# Patient Record
Sex: Male | Born: 1970 | Race: Black or African American | Hispanic: No | Marital: Married | Smoking: Never smoker
Health system: Southern US, Community
[De-identification: ages and names within clinical notes are randomized; demographics above are authoritative.]

## PROBLEM LIST (undated history)

## (undated) DIAGNOSIS — I10 Essential (primary) hypertension: Secondary | ICD-10-CM

## (undated) DIAGNOSIS — N289 Disorder of kidney and ureter, unspecified: Secondary | ICD-10-CM

## (undated) DIAGNOSIS — F419 Anxiety disorder, unspecified: Secondary | ICD-10-CM

## (undated) DIAGNOSIS — K219 Gastro-esophageal reflux disease without esophagitis: Secondary | ICD-10-CM

## (undated) DIAGNOSIS — J302 Other seasonal allergic rhinitis: Secondary | ICD-10-CM

## (undated) DIAGNOSIS — N183 Chronic kidney disease, stage 3 (moderate): Secondary | ICD-10-CM

## (undated) DIAGNOSIS — E119 Type 2 diabetes mellitus without complications: Secondary | ICD-10-CM

## (undated) DIAGNOSIS — M1990 Unspecified osteoarthritis, unspecified site: Secondary | ICD-10-CM

## (undated) HISTORY — DX: Essential (primary) hypertension: I10

## (undated) HISTORY — DX: Chronic kidney disease, stage 3 (moderate): N18.3

## (undated) HISTORY — DX: Anxiety disorder, unspecified: F41.9

## (undated) HISTORY — DX: Type 2 diabetes mellitus without complications: E11.9

## (undated) HISTORY — DX: Gastro-esophageal reflux disease without esophagitis: K21.9

---

## 2002-09-29 ENCOUNTER — Encounter: Payer: Self-pay | Admitting: *Deleted

## 2002-09-29 ENCOUNTER — Emergency Department (HOSPITAL_COMMUNITY): Admission: EM | Admit: 2002-09-29 | Discharge: 2002-09-29 | Payer: Self-pay | Admitting: *Deleted

## 2004-01-03 ENCOUNTER — Encounter: Admission: RE | Admit: 2004-01-03 | Discharge: 2004-02-26 | Payer: Self-pay | Admitting: Family Medicine

## 2008-07-18 ENCOUNTER — Ambulatory Visit (HOSPITAL_COMMUNITY): Payer: Self-pay | Admitting: Psychiatry

## 2008-07-19 ENCOUNTER — Ambulatory Visit (HOSPITAL_COMMUNITY): Payer: Self-pay | Admitting: Psychiatry

## 2008-07-20 ENCOUNTER — Inpatient Hospital Stay (HOSPITAL_COMMUNITY): Admission: AD | Admit: 2008-07-20 | Discharge: 2008-07-23 | Payer: Self-pay | Admitting: *Deleted

## 2008-07-20 ENCOUNTER — Ambulatory Visit: Payer: Self-pay | Admitting: *Deleted

## 2008-07-25 ENCOUNTER — Ambulatory Visit (HOSPITAL_COMMUNITY): Payer: Self-pay | Admitting: Psychiatry

## 2008-07-26 ENCOUNTER — Ambulatory Visit (HOSPITAL_COMMUNITY): Payer: Self-pay | Admitting: Psychiatry

## 2008-08-02 ENCOUNTER — Ambulatory Visit (HOSPITAL_COMMUNITY): Payer: Self-pay | Admitting: Psychiatry

## 2008-08-15 ENCOUNTER — Ambulatory Visit (HOSPITAL_COMMUNITY): Payer: Self-pay | Admitting: Psychiatry

## 2008-08-29 ENCOUNTER — Ambulatory Visit (HOSPITAL_COMMUNITY): Payer: Self-pay | Admitting: Psychiatry

## 2008-09-12 ENCOUNTER — Ambulatory Visit (HOSPITAL_COMMUNITY): Payer: Self-pay | Admitting: Psychiatry

## 2008-09-26 ENCOUNTER — Ambulatory Visit (HOSPITAL_COMMUNITY): Payer: Self-pay | Admitting: Psychiatry

## 2010-01-06 ENCOUNTER — Emergency Department (HOSPITAL_COMMUNITY): Admission: EM | Admit: 2010-01-06 | Discharge: 2010-01-06 | Payer: Self-pay | Admitting: Emergency Medicine

## 2010-10-13 LAB — COMPREHENSIVE METABOLIC PANEL
ALT: 23 U/L (ref 0–35)
Alkaline Phosphatase: 91 U/L (ref 39–117)
BUN: 6 mg/dL (ref 6–23)
CO2: 28 mEq/L (ref 19–32)
Calcium: 8.9 mg/dL (ref 8.4–10.5)
GFR calc non Af Amer: 38 mL/min — ABNORMAL LOW (ref >60)
Glucose, Bld: 116 mg/dL — ABNORMAL HIGH (ref 70–99)
Potassium: 3.9 mEq/L (ref 3.5–5.1)
Total Protein: 7.5 g/dL (ref 6.0–8.3)

## 2010-10-13 LAB — URINALYSIS, ROUTINE W REFLEX MICROSCOPIC
Bilirubin Urine: NEGATIVE
Glucose, UA: NEGATIVE mg/dL
Ketones, ur: NEGATIVE mg/dL
Specific Gravity, Urine: 1.011 (ref 1.005–1.030)
pH: 7 (ref 5.0–8.0)

## 2010-10-13 LAB — CBC
HCT: 45 % (ref 36.0–46.0)
Hemoglobin: 15 g/dL (ref 12.0–15.0)
MCHC: 33.2 g/dL (ref 30.0–36.0)
RBC: 4.97 MIL/uL (ref 3.87–5.11)
RDW: 13.4 % (ref 11.5–15.5)

## 2010-10-13 LAB — DRUGS OF ABUSE SCREEN W/O ALC, ROUTINE URINE
Amphetamine Screen, Ur: NEGATIVE
Barbiturate Quant, Ur: NEGATIVE
Benzodiazepines.: NEGATIVE
Marijuana Metabolite: NEGATIVE
Methadone: NEGATIVE
Phencyclidine (PCP): NEGATIVE

## 2010-11-11 NOTE — Discharge Summary (Signed)
NAME:  James Griffith, James Griffith                 ACCOUNT NO.:  [id number]   MEDICAL RECORD NO.:  [id number]          PATIENT TYPE:  IPS   LOCATION:  0302                          FACILITY:  [facility]   PHYSICIAN:  Jasmine Pang, M.D. DATE OF BIRTH:  [birth year]   DATE OF ADMISSION:  07/20/2008  DATE OF DISCHARGE:                               DISCHARGE SUMMARY   IDENTIFICATION:  This is a 40 year old married African American male who  was admitted on a voluntary basis on July 18, 2008.   HISTORY OF PRESENT ILLNESS:  The patient was referred for inpatient by  Dr. Lolly Mustache, who saw him for the first time on Thursday.  Apparently, the  patient reported that he has been having suicidal thoughts, increased  impulsivity and had attempted overdose on an over-the-counter sleeping  pills twice followed with alcohol.  He states a major stressor for him  is separation from his wife.  Separation from his wife once at  Thanksgiving and once before Christmas.  Also, around Christmas, he  tried to shoot himself, but the gun did not work.  He states that when  he started feeling down, he planned to try again.  His wife is in a new  relationship, which has been discouraging for him.  Dr. Lolly Mustache felt that  the patient required immediate hospitalization on Thursday.  However,  the patient contracted for safety and at first be able to tie up with  the business matters before presenting for admission.  He did present as  promised on Friday, July 20, 2008.  Apparently, he had a minor  confrontation with the man, his wife is currently seeing.  He is only  sleeping 2 to 3 hours a night.  He is not eating.  He has lost 20  pounds.  He states that prior to this, he just had normal worry.  He  reports that he has been married approximately 5 years.  He states that  early in the relationship, he was unfaithful that was approximately 2-  1/2 to 3 years ago.  He states his wife forgave him, but they continued  to have a lot of  conflict resulting in their separation.   PAST PSYCHIATRIC HISTORY:  The patient states that after a bad  relationship in 1993, he was admitted to [facility].  He would stay up for  2 to 3 days at a time back again, but he had not had any medications  since then.   FAMILY HISTORY:  The patient had a male cousin who had been  institutionalized few times.   DRUGS AND ALCOHOL HISTORY:  The patient denies.   MEDICAL PROBLEMS:  The patient is known to have a torn rotator cuff in  2006.   MEDICATIONS:  In 1993, he took Zoloft for awhile after a bad  relationship.   DRUG ALLERGIES:  No known drug allergies.   PHYSICAL FINDINGS:  The patient was a well-developed, well-nourished  male, who appeared his stated age of 43.  He is known to have a torn  left rotator cuff, otherwise he had no remarkable  symptoms or physical  findings.   ADMISSION LABORATORIES:  UDS was negative.  Chemistries showed his  glucose was slightly elevated at 116.  His urinalysis was negative.  CBC  revealed slightly elevated WBC at 11.   HOSPITAL COURSE:  Upon admission, the patient was started on Zyprexa 5  mg p.o. q.h.s. as well as Ambien 10 mg p.o. q.h.s. p.r.n. and he was  also started on Celexa 20 mg p.o. q.h.s.  On July 21, 2008, Zyprexa  Zydis was increased to 10 mg at h.s.  In addition, he was started on  Sudafed 60 mg q.4 to 6 h. not to exceed 240 mg daily.  In individual  sessions with me, the patient was friendly and cooperative.  There was  psychomotor retardation.  His speech was normal rate and flow.  He was  alert.  He was depressed and anxious with a consisted affect.  Anxiety  level was moderate-to-severe.  There was no evidence of psychosis or  thought disorder.  On July 22, 2008, his sleep was good.  Appetite  was good.  He was less depressed, less anxious.  He discussed his recent  suicide attempts, but stated he had no suicidal ideation now.  He began  to look forward to going home.  On  July 23, 2008, the patient's mood  was stable.  Affect consistent with mood.  There was no suicidal or  homicidal ideation.  No thoughts of self-injurious behavior.  No  auditory or visual hallucinations.  No paranoia or delusions.  Thoughts  were logical and goal-directed.  Thought content no predominant theme.  Cognitive grossly intact.  Insight good.  Judgment good.  Impulse  control good.  He will turn to [place] to live.  He has a follow up  appointment with Dr. Lolly Mustache at the [facility] office.  He  will also be seen Florencia Reasons for counseling.  He did not want any  family contact by phone or family session.  He was given a list of  support groups and encouraged to attend these.  It was felt the patient  was safe for discharge today.   DISCHARGE DIAGNOSES:  Axis I:  Mood disorder, not otherwise specified.  Axis II:  None.  Axis III:  Torn left rotator cuff.  Axis IV:  Severe (problems with primary support group, burden of  psychiatric illness, pain from torn rotator cuff, other psychosocial  problems).  Axis V:  Global assessment of functioning was 50 upon discharge.  GAF  was 30 upon admission.  GAF was 65 to 70 highest past year.   DISCHARGE PLANS:  There was no activity level or dietary restrictions.   POSTHOSPITAL CARE PLANS:  The patient will see Dr. Lolly Mustache on January  26th at 8:45 a.m.  He will also see Florencia Reasons, counselor on July 25, 2008, at 3 p.m.   DISCHARGE MEDICATIONS:  1. Celexa 20 mg daily.  2. Zyprexa Zydis 10 mg daily.      Jasmine Pang, M.D.  Electronically Signed     BHS/MEDQ  D:  07/23/2008  T:  07/24/2008  Job:  [id number]

## 2010-11-11 NOTE — H&P (Signed)
NAME:  James Griffith, James Griffith                 ACCOUNT NO.:  [id number]   MEDICAL RECORD NO.:  [id number]          PATIENT TYPE:  IPS   LOCATION:  0302                          FACILITY:  [facility]   PHYSICIAN:  Jasmine Pang, M.D. DATE OF BIRTH:  Aug 15, 1970   DATE OF ADMISSION:  07/20/2008  DATE OF DISCHARGE:                       PSYCHIATRIC ADMISSION ASSESSMENT   This is a voluntary admission to the services of Dr. Milford Cage.   IDENTIFYING INFORMATION:  This is a 40 year old married African American  male.  He was referred for inpatient by Dr. Lolly Mustache, who saw him for the  first time on Thursday.  Apparently, the patient reported that he had  been having suicidal thoughts, increased impulsivity.  He has attempted  to overdose on over-the-counter sleeping pills twice, followed with  alcohol, since his wife separated from him.  Once at Thanksgiving and  once before Christmas.  Also, around Christmas, he tried to shoot  himself, but the gun did not work.  He states that when he starts  feeling really, really down, he will try again.  His wife is in a new  relationship.  Dr. Lolly Mustache felt that the patient required immediate  hospitalization on Thursday; however, the patient contracted for safety  and requested that he be able to tie up some business matters before  presenting for admission.  He did present for admission as promised  yesterday.  Apparently, he has had a recent minor confrontation with the  man his wife is currently seeing.  He is only sleeping 2-3 hours a  night.  He is not eating.  He has lost 20 pounds . He states that prior  to this, he just had normal worry.   He reports that he has been married approximately 5 years, that early in  the relationship he was unfaithful.  This was approximately 2-1/2 to 3  years ago.  His wife forgave him.   PAST PSYCHIATRIC HISTORY:  He states that after a bad relationship in  1993, he was admitted to [facility].  He would stay up for 2-3 days at  a  time back then, but he has not had any medications since then.   SOCIAL HISTORY:  He went to the 12th grade but did not graduate.  He has  been married 5 years.  He has 3 stepchildren.  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   FAMILY HISTORY:  He has a male cousin who has been institutionalized a  few times.   ALCOHOL/DRUG HISTORY:  He denies.   PRIMARY CARE Shanayah Kaffenberger:  [facility].  He just had his  first appointment with Dr. Lolly Mustache the other day.   MEDICAL PROBLEMS:  He is known to have a torn rotator cuff since 2006.   MEDICATIONS:  Back in 1993, he did take Zoloft for a while after a bad  relationship.   DRUG ALLERGIES:  No known drug allergies.   POSITIVE PHYSICAL FINDINGS:  A well-developed and well-nourished male  who appears his stated age of 61.  VITAL SIGNS:  He is 5 foot 7.  Weight is 219.  Temperature is 97.4.  Blood pressure 118/84.  Pulse 75 to 77.  Respirations 18.  He is known to have a torn left rotator cuff, otherwise he had no  remarkable symptoms or physical findings.   His lab studies that we have gotten so far, his UDS is completely  negative.  His chemistries showed his sugar was a little elevated at  116.  His urinalysis was negative.  His CBC showed a slightly elevated  WBC at 11.   MENTAL STATUS EXAM:  Today, he is alert and oriented.  He is  appropriately groomed, dressed, and nourished.  He had moderately good  eye contact.  His speech is not pressured.  His mood is appropriate to  the situation.  His thought processes are clear, rational, and goal-  oriented.  He does have a plan on where he can stay, not staying with  his wife and the children at present.  Judgment and insight are intact.  Concentration and memory are intact.  Intelligence is at least average.  He denies being actively suicidal or homicidal.  He denies auditory or  visual hallucinations.  He is hoping to get something that will help him  sleep and  decrease his worry.   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   AXIS II:  Deferred.   AXIS III:  Torn left rotator cuff.   AXIS IV:  Problems with primary support group.   AXIS V:  30.   PLAN:  Adjust the Zyprexa, as Dr. Lolly Mustache had indicated, to 10 mg  nightly.  Will also start some Celexa 20 mg nightly.  Will have the  counselor please help set up a family session with his wife.   ESTIMATED LENGTH OF STAY:  3-5 days.      Mickie Leonarda Salon, P.A.-C.      Jasmine Pang, M.D.  Electronically Signed    MD/MEDQ  D:  07/21/2008  T:  07/21/2008  Job:  [id number]

## 2013-02-09 ENCOUNTER — Encounter: Payer: Self-pay | Admitting: Family Medicine

## 2013-02-09 ENCOUNTER — Ambulatory Visit (INDEPENDENT_AMBULATORY_CARE_PROVIDER_SITE_OTHER): Payer: No Typology Code available for payment source | Admitting: Family Medicine

## 2013-02-09 VITALS — BP 182/114 | HR 76 | Temp 98.1°F | Ht 71.0 in | Wt 242.2 lb

## 2013-02-09 DIAGNOSIS — I10 Essential (primary) hypertension: Secondary | ICD-10-CM

## 2013-02-09 DIAGNOSIS — K219 Gastro-esophageal reflux disease without esophagitis: Secondary | ICD-10-CM

## 2013-02-09 DIAGNOSIS — R0989 Other specified symptoms and signs involving the circulatory and respiratory systems: Secondary | ICD-10-CM

## 2013-02-09 DIAGNOSIS — R0609 Other forms of dyspnea: Secondary | ICD-10-CM

## 2013-02-09 DIAGNOSIS — R0683 Snoring: Secondary | ICD-10-CM

## 2013-02-09 DIAGNOSIS — M129 Arthropathy, unspecified: Secondary | ICD-10-CM

## 2013-02-09 DIAGNOSIS — R5383 Other fatigue: Secondary | ICD-10-CM

## 2013-02-09 DIAGNOSIS — R5381 Other malaise: Secondary | ICD-10-CM

## 2013-02-09 DIAGNOSIS — R7989 Other specified abnormal findings of blood chemistry: Secondary | ICD-10-CM

## 2013-02-09 DIAGNOSIS — Z0000 Encounter for general adult medical examination without abnormal findings: Secondary | ICD-10-CM

## 2013-02-09 DIAGNOSIS — M1990 Unspecified osteoarthritis, unspecified site: Secondary | ICD-10-CM

## 2013-02-09 LAB — POCT CBC
Granulocyte percent: 72.8 %G (ref 37–80)
HCT, POC: 45.1 % (ref 43.5–53.7)
Hemoglobin: 15.1 g/dL (ref 14.1–18.1)
Lymph, poc: 2.8 (ref 0.6–3.4)
MCH, POC: 29 pg (ref 27–31.2)
MCHC: 33.4 g/dL (ref 31.8–35.4)
MCV: 86.9 fL (ref 80–97)
MPV: 8.3 fL (ref 0–99.8)
POC Granulocyte: 8.7 — AB (ref 2–6.9)
POC LYMPH PERCENT: 23.2 %L (ref 10–50)
Platelet Count, POC: 260 10*3/uL (ref 142–424)
RBC: 5.2 M/uL (ref 4.69–6.13)
RDW, POC: 12.9 %
WBC: 12 10*3/uL — AB (ref 4.6–10.2)

## 2013-02-09 MED ORDER — AMLODIPINE-VALSARTAN-HCTZ 10-160-12.5 MG PO TABS: 1.0000 | ORAL_TABLET | ORAL | Status: DC

## 2013-02-09 MED ORDER — OMEPRAZOLE 20 MG PO CPDR: 20.0000 mg | DELAYED_RELEASE_CAPSULE | Freq: Every day | ORAL | Status: DC

## 2013-02-09 MED ORDER — DICLOFENAC SODIUM 75 MG PO TBEC: 75.0000 mg | DELAYED_RELEASE_TABLET | Freq: Two times a day (BID) | ORAL | Status: DC

## 2013-02-09 NOTE — Progress Notes (Signed)
  Subjective:    Patient ID: James Griffith, male    DOB: [birth year], 42 y.o.   MRN: [id number]  HPI This 42 y.o. male presents for evaluation of shoulder pain in his right shoulder. He states it popped out and then popped back in. He has bilateral knee pain. He has been having some abdominal pain.  He has elevated blood pressure..   Review of Systems No chest pain, SOB, HA, dizziness, vision change, N/V, diarrhea, constipation, dysuria, urinary urgency or frequency, myalgias, arthralgias or rash.     Objective:   Physical Exam Vital signs noted  Well developed well nourished male.  HEENT - Head atraumatic Normocephalic                Eyes - PERRLA, Conjuctiva - clear Sclera- Clear EOMI                Ears - EAC's Wnl TM's Wnl Gross Hearing WNL                Nose - Nares patent                 Throat - oropharanx wnl Respiratory - Lungs CTA bilateral Cardiac - RRR S1 and S2 without murmur GI - Abdomen soft Nontender and bowel sounds active x 4 Extremities - No edema. Neuro - Grossly intact.       Assessment & Plan:  Essential hypertension, benign - Plan: Amlodipine-Valsartan-HCTZ 10-160-12.5 MG TABS  Snoring - Plan: Split night study  Other malaise and fatigue - Plan: POCT CBC, CMP14+EGFR, TSH  Routine general medical examination at a health care facility - Plan: POCT CBC, Lipid panel, CMP14+EGFR, TSH, PSA, total and free  Arthritis - Plan: diclofenac (VOLTAREN) 75 MG EC tablet  GERD (gastroesophageal reflux disease) - Plan: omeprazole (PRILOSEC) 20 MG capsule

## 2013-02-10 ENCOUNTER — Other Ambulatory Visit: Payer: Self-pay | Admitting: Family Medicine

## 2013-02-10 DIAGNOSIS — R739 Hyperglycemia, unspecified: Secondary | ICD-10-CM

## 2013-02-10 LAB — LIPID PANEL
Chol/HDL Ratio: 3.9 ratio units (ref 0.0–5.0)
Cholesterol, Total: 181 mg/dL (ref 100–199)
HDL: 46 mg/dL (ref >39)
LDL Calculated: 118 mg/dL — ABNORMAL HIGH (ref 0–99)
Triglycerides: 83 mg/dL (ref 0–149)
VLDL Cholesterol Cal: 17 mg/dL (ref 5–40)

## 2013-02-10 LAB — CMP14+EGFR
ALT: 27 IU/L (ref 0–44)
AST: 33 IU/L (ref 0–40)
Albumin/Globulin Ratio: 1.4 (ref 1.1–2.5)
Albumin: 4.7 g/dL (ref 3.5–5.5)
Alkaline Phosphatase: 112 IU/L (ref 39–117)
BUN/Creatinine Ratio: 8 — ABNORMAL LOW (ref 9–20)
BUN: 14 mg/dL (ref 6–24)
CO2: 23 mmol/L (ref 18–29)
Calcium: 9.5 mg/dL (ref 8.7–10.2)
Chloride: 102 mmol/L (ref 97–108)
Creatinine, Ser: 1.84 mg/dL — ABNORMAL HIGH (ref 0.76–1.27)
GFR calc Af Amer: 51 mL/min/{1.73_m2} — ABNORMAL LOW (ref >59)
GFR calc non Af Amer: 44 mL/min/{1.73_m2} — ABNORMAL LOW (ref >59)
Globulin, Total: 3.3 g/dL (ref 1.5–4.5)
Glucose: 114 mg/dL — ABNORMAL HIGH (ref 65–99)
Potassium: 3.8 mmol/L (ref 3.5–5.2)
Sodium: 136 mmol/L (ref 134–144)
Total Bilirubin: 0.6 mg/dL (ref 0.0–1.2)
Total Protein: 8 g/dL (ref 6.0–8.5)

## 2013-02-10 LAB — TSH: TSH: 1.43 u[IU]/mL (ref 0.450–4.500)

## 2013-02-10 LAB — PSA, TOTAL AND FREE
PSA, Free Pct: 27.5 %
PSA, Free: 0.22 ng/mL
PSA: 0.8 ng/mL (ref 0.0–4.0)

## 2013-02-10 NOTE — Patient Instructions (Signed)

## 2013-02-13 ENCOUNTER — Telehealth: Payer: Self-pay | Admitting: Family Medicine

## 2013-02-14 LAB — POCT GLYCOSYLATED HEMOGLOBIN (HGB A1C): Hemoglobin A1C: 6.1

## 2013-02-14 NOTE — Addendum Note (Signed)
Addended by: Lisbeth Ply C on: 02/14/2013 05:09 PM   Modules accepted: Orders

## 2013-02-15 ENCOUNTER — Ambulatory Visit (INDEPENDENT_AMBULATORY_CARE_PROVIDER_SITE_OTHER): Payer: No Typology Code available for payment source | Admitting: Family Medicine

## 2013-02-15 ENCOUNTER — Encounter: Payer: Self-pay | Admitting: Family Medicine

## 2013-02-15 ENCOUNTER — Telehealth: Payer: Self-pay | Admitting: Family Medicine

## 2013-02-15 VITALS — BP 145/94 | HR 86 | Temp 97.4°F | Wt 242.4 lb

## 2013-02-15 DIAGNOSIS — R110 Nausea: Secondary | ICD-10-CM

## 2013-02-15 DIAGNOSIS — M1990 Unspecified osteoarthritis, unspecified site: Secondary | ICD-10-CM

## 2013-02-15 DIAGNOSIS — M129 Arthropathy, unspecified: Secondary | ICD-10-CM

## 2013-02-15 DIAGNOSIS — R0609 Other forms of dyspnea: Secondary | ICD-10-CM

## 2013-02-15 DIAGNOSIS — R0683 Snoring: Secondary | ICD-10-CM

## 2013-02-15 DIAGNOSIS — J309 Allergic rhinitis, unspecified: Secondary | ICD-10-CM

## 2013-02-15 DIAGNOSIS — R5381 Other malaise: Secondary | ICD-10-CM

## 2013-02-15 DIAGNOSIS — J302 Other seasonal allergic rhinitis: Secondary | ICD-10-CM

## 2013-02-15 DIAGNOSIS — I10 Essential (primary) hypertension: Secondary | ICD-10-CM

## 2013-02-15 MED ORDER — FLUTICASONE PROPIONATE 50 MCG/ACT NA SUSP: 2.0000 | Freq: Every day | NASAL | Status: AC

## 2013-02-15 MED ORDER — MELOXICAM 7.5 MG PO TABS: 7.5000 mg | ORAL_TABLET | Freq: Every day | ORAL | Status: DC

## 2013-02-15 NOTE — Progress Notes (Signed)
  Subjective:    Patient ID: James Griffith, male    DOB: [birth year], 42 y.o.   MRN: [id number]  HPI This 42 y.o. male presents for evaluation of follow up on his blood pressure and labs. He had some elevated glucose and his hgbaic was 6.1%.  He has been feeling nauseated since taking His voltaren and has stopped.  He has problems with excessive fatigue and has problems with snoring and His wife states he stops breathing at night.   Review of Systems No chest pain, SOB, HA, dizziness, vision change, N/V, diarrhea, constipation, dysuria, urinary urgency or frequency, myalgias, arthralgias or rash.     Objective:   Physical Exam Vital signs noted  Well developed well nourished male.  HEENT - Head atraumatic Normocephalic                Eyes - PERRLA, Conjuctiva - clear Sclera- Clear EOMI                Ears - EAC's Wnl TM's Wnl Gross Hearing WNL                Nose - Nares patent                 Throat - oropharanx wnl Respiratory - Lungs CTA bilateral Cardiac - RRR S1 and S2 without murmur GI - Abdomen soft Nontender and bowel sounds active x 4 Extremities - No edema. Neuro - Grossly intact.       Assessment & Plan:  Snoring -  Split sleep study  Nausea alone - DC voltaren  Arthritis - Plan: meloxicam (MOBIC) 7.5 MG tablet po qd  Essential hypertension, benign - Continue current regimen  Other malaise and fatigue - Plan: Testosterone,Free and Total  Seasonal allergies - Plan: fluticasone (FLONASE) 50 MCG/ACT nasal spray and take claritin otc.

## 2013-02-15 NOTE — Patient Instructions (Signed)

## 2013-02-15 NOTE — Telephone Encounter (Signed)
Pt seen today with bill oxford

## 2013-02-15 NOTE — Telephone Encounter (Signed)
Pt seen today by Ander Slade

## 2013-03-08 ENCOUNTER — Ambulatory Visit: Payer: PRIVATE HEALTH INSURANCE | Attending: Family Medicine | Admitting: Sleep Medicine

## 2013-03-08 DIAGNOSIS — G4733 Obstructive sleep apnea (adult) (pediatric): Secondary | ICD-10-CM | POA: Insufficient documentation

## 2013-03-08 DIAGNOSIS — R0683 Snoring: Secondary | ICD-10-CM

## 2013-03-08 DIAGNOSIS — Z6839 Body mass index (BMI) 39.0-39.9, adult: Secondary | ICD-10-CM | POA: Insufficient documentation

## 2013-03-11 NOTE — Procedures (Signed)
[facility] Azarria Balint A. Gerilyn Pilgrim, MD     [email or web address]        NAME:  James Griffith, James Griffith Destry                 ACCOUNT NO.:  [id number]  MEDICAL RECORD NO.:  [id number]          PATIENT TYPE:  OUT  LOCATION:  SLEEP LAB                     FACILITY:  [facility]  PHYSICIAN:  Jenaya Saar A. Gerilyn Pilgrim, M.D. DATE OF BIRTH:  04-17-1971  DATE OF STUDY:  03/08/2013                           NOCTURNAL POLYSOMNOGRAM  REFERRING PHYSICIAN:  Anselm Pancoast OXFORD  INDICATION:  This is a 42 year old who presents with fatigue, obesity, and hypersomnia.  The study is being done to evaluate for obstructive sleep apnea syndrome.   MEDICATIONS:  Omeprazole, meloxicam, Exforge, hydrochlorothiazide, Flonase.  EPWORTH SLEEPINESS SCALE:  50.  BMI:  39.  ARCHITECTURAL SUMMARY:  The total recording time is 361 minute.  Sleep efficiency 88%, sleep latency 6 minutes.  REM latency 47 minutes.  Stage N1 is 4% N2 is 69% N3 is 0%, and REM sleep 26%.  RESPIRATORY SUMMARY:  Baseline oxygen saturation is 94, lowest saturation 79 during REM sleep.  Diagnostic AHI is 16 and RDI 17.  The events occurred more during REM sleep with a REM AHI being 43.  LIMB MOVEMENT SUMMARY:  PLM index 0.  ELECTROCARDIOGRAM SUMMARY:  Average heart rate is 77 with no significant dysrhythmias observed.  IMPRESSION: 1. Moderate obstructive sleep apnea syndrome, that is worse during REM     sleep. 2. Abnormal sleep architecture with early sleep latency, and absent     slow wave sleep.  RECOMMENDATION:  Formal CPAP titration recording.     Kisa Fujii A. Gerilyn Pilgrim, M.D.    KAD/MEDQ  D:  03/11/2013 14:33:21  T:  03/11/2013 14:43:26  Job:  [id number]

## 2013-03-13 ENCOUNTER — Other Ambulatory Visit: Payer: Self-pay | Admitting: Family Medicine

## 2013-03-14 ENCOUNTER — Encounter: Payer: Self-pay | Admitting: Family Medicine

## 2013-03-21 ENCOUNTER — Other Ambulatory Visit: Payer: Self-pay | Admitting: Family Medicine

## 2013-03-21 DIAGNOSIS — G4733 Obstructive sleep apnea (adult) (pediatric): Secondary | ICD-10-CM

## 2013-03-22 ENCOUNTER — Other Ambulatory Visit: Payer: Self-pay | Admitting: *Deleted

## 2013-03-22 DIAGNOSIS — G4730 Sleep apnea, unspecified: Secondary | ICD-10-CM

## 2013-03-24 ENCOUNTER — Other Ambulatory Visit: Payer: Self-pay | Admitting: Family Medicine

## 2013-03-24 DIAGNOSIS — G4733 Obstructive sleep apnea (adult) (pediatric): Secondary | ICD-10-CM

## 2013-04-12 ENCOUNTER — Other Ambulatory Visit: Payer: Self-pay | Admitting: Family Medicine

## 2013-04-12 DIAGNOSIS — G4733 Obstructive sleep apnea (adult) (pediatric): Secondary | ICD-10-CM

## 2013-05-02 ENCOUNTER — Other Ambulatory Visit: Payer: Self-pay | Admitting: Family Medicine

## 2013-05-02 DIAGNOSIS — G4733 Obstructive sleep apnea (adult) (pediatric): Secondary | ICD-10-CM

## 2013-05-04 ENCOUNTER — Other Ambulatory Visit: Payer: Self-pay

## 2013-05-17 ENCOUNTER — Other Ambulatory Visit: Payer: Self-pay | Admitting: Family Medicine

## 2013-05-22 ENCOUNTER — Encounter: Payer: Self-pay | Admitting: *Deleted

## 2013-05-29 ENCOUNTER — Encounter: Payer: Self-pay | Admitting: *Deleted

## 2013-06-08 ENCOUNTER — Telehealth: Payer: Self-pay | Admitting: Family Medicine

## 2013-06-12 NOTE — Telephone Encounter (Signed)
Pt said he found note.

## 2013-06-14 ENCOUNTER — Ambulatory Visit: Payer: No Typology Code available for payment source | Admitting: Family Medicine

## 2013-06-16 ENCOUNTER — Ambulatory Visit: Payer: No Typology Code available for payment source | Admitting: Family Medicine

## 2013-06-19 ENCOUNTER — Ambulatory Visit: Payer: No Typology Code available for payment source | Admitting: Family Medicine

## 2013-07-07 ENCOUNTER — Emergency Department (HOSPITAL_BASED_OUTPATIENT_CLINIC_OR_DEPARTMENT_OTHER): Payer: PRIVATE HEALTH INSURANCE

## 2013-07-07 ENCOUNTER — Encounter (HOSPITAL_BASED_OUTPATIENT_CLINIC_OR_DEPARTMENT_OTHER): Payer: Self-pay | Admitting: Emergency Medicine

## 2013-07-07 ENCOUNTER — Emergency Department (HOSPITAL_BASED_OUTPATIENT_CLINIC_OR_DEPARTMENT_OTHER)
Admission: EM | Admit: 2013-07-07 | Discharge: 2013-07-07 | Disposition: A | Discharge status: Home / Self Care | Payer: PRIVATE HEALTH INSURANCE | Attending: Emergency Medicine | Admitting: Emergency Medicine

## 2013-07-07 DIAGNOSIS — J189 Pneumonia, unspecified organism: Secondary | ICD-10-CM

## 2013-07-07 DIAGNOSIS — J159 Unspecified bacterial pneumonia: Secondary | ICD-10-CM | POA: Insufficient documentation

## 2013-07-07 DIAGNOSIS — Z791 Long term (current) use of non-steroidal anti-inflammatories (NSAID): Secondary | ICD-10-CM | POA: Insufficient documentation

## 2013-07-07 DIAGNOSIS — Z8659 Personal history of other mental and behavioral disorders: Secondary | ICD-10-CM | POA: Insufficient documentation

## 2013-07-07 DIAGNOSIS — Z79899 Other long term (current) drug therapy: Secondary | ICD-10-CM | POA: Insufficient documentation

## 2013-07-07 DIAGNOSIS — I10 Essential (primary) hypertension: Secondary | ICD-10-CM | POA: Insufficient documentation

## 2013-07-07 DIAGNOSIS — IMO0002 Reserved for concepts with insufficient information to code with codable children: Secondary | ICD-10-CM | POA: Insufficient documentation

## 2013-07-07 DIAGNOSIS — IMO0001 Reserved for inherently not codable concepts without codable children: Secondary | ICD-10-CM | POA: Insufficient documentation

## 2013-07-07 HISTORY — DX: Essential (primary) hypertension: I10

## 2013-07-07 MED ORDER — AZITHROMYCIN 250 MG PO TABS: 250.0000 mg | ORAL_TABLET | Freq: Every day | ORAL | Status: DC

## 2013-07-07 MED ORDER — GUAIFENESIN 100 MG/5ML PO SOLN
5.0000 mL | Freq: Once | ORAL | Status: AC
  Administered 2013-07-07: 100 mg via ORAL
  Filled 2013-07-07: qty 25

## 2013-07-07 MED ORDER — HYDROCODONE-HOMATROPINE 5-1.5 MG/5ML PO SYRP: 5.0000 mL | ORAL_SOLUTION | Freq: Four times a day (QID) | ORAL | Status: DC | PRN

## 2013-07-07 MED ORDER — OXYCODONE-ACETAMINOPHEN 5-325 MG PO TABS
2.0000 | ORAL_TABLET | Freq: Once | ORAL | Status: AC
  Administered 2013-07-07: 2 via ORAL
  Filled 2013-07-07: qty 2

## 2013-07-07 NOTE — Discharge Instructions (Signed)
Take azithromycin as directed until gone. Take hycodan as needed for cough. Refer to attached documents for more information. Return to the ED with worsening or concerning symptoms.  °

## 2013-07-07 NOTE — ED Provider Notes (Signed)
CSN: [id number]     Arrival date & time 07/07/13  1954 History   First MD Initiated Contact with Patient 07/07/13 2006     Chief Complaint  Patient presents with  . Flu like symptoms    (Consider location/radiation/quality/duration/timing/severity/associated sxs/prior Treatment) HPI Comments: Patient is a 43 year old male with a past medical history of hypertension and anxiety who presents with a 2 day history of flu-like symptoms. Patient reports gradual onset and progressive worsening of symptoms. Patient reports cough, fever of 102.7, sore throat, and generalized body aches. Patient has tried OTC medications without relief. No aggravating/alleviating factors. No known sick contact.    Past Medical History  Diagnosis Date  . Anxiety   . Hypertension    History reviewed. No pertinent past surgical history. Family History  Problem Relation Age of Onset  . Diabetes Mother   . Hypertension Mother   . Heart disease Mother   . Diabetes Father   . Hypertension Father   . Heart disease Father    History  Substance Use Topics  . Smoking status: Never Smoker   . Smokeless tobacco: Not on file  . Alcohol Use: No    Review of Systems  Constitutional: Positive for fever. Negative for chills and fatigue.  HENT: Positive for sore throat. Negative for trouble swallowing.   Eyes: Negative for visual disturbance.  Respiratory: Positive for cough. Negative for shortness of breath.   Cardiovascular: Negative for chest pain and palpitations.  Gastrointestinal: Negative for nausea, vomiting, abdominal pain and diarrhea.  Genitourinary: Negative for dysuria and difficulty urinating.  Musculoskeletal: Positive for myalgias. Negative for arthralgias and neck pain.  Skin: Negative for color change.  Neurological: Negative for dizziness and weakness.  Psychiatric/Behavioral: Negative for dysphoric mood.    Allergies  Review of patient's allergies indicates no known allergies.  Home  Medications   Current Outpatient Rx  Name  Route  Sig  Dispense  Refill  . Amlodipine-Valsartan-HCTZ 10-160-12.5 MG TABS   Oral   Take 1 capsule by mouth 1 day or 1 dose.   30 tablet   11   . fluticasone (FLONASE) 50 MCG/ACT nasal spray   Nasal   Place 2 sprays into the nose daily.   16 g   11   . loratadine (CLARITIN REDITABS) 10 MG dissolvable tablet   Oral   Take 10 mg by mouth daily.         . meloxicam (MOBIC) 7.5 MG tablet   Oral   Take 1 tablet (7.5 mg total) by mouth daily.   30 tablet   5   . omeprazole (PRILOSEC) 20 MG capsule   Oral   Take 1 capsule (20 mg total) by mouth daily.   30 capsule   3    BP 141/91  Pulse 113  Temp(Src) 102.2 F (39 C) (Oral)  Resp 16  Ht 5\' 7"  (1.702 m)  Wt 240 lb (108.863 kg)  BMI 37.58 kg/m2  SpO2 96% Physical Exam  Nursing note and vitals reviewed. Constitutional: He is oriented to person, place, and time. He appears well-developed and well-nourished. No distress.  Patient coughing throughout interview and exam.   HENT:  Head: Normocephalic and atraumatic.  Mouth/Throat: Oropharynx is clear and moist. No oropharyngeal exudate.  Eyes: Conjunctivae and EOM are normal.  Neck: Normal range of motion.  Cardiovascular: Normal rate and regular rhythm.  Exam reveals no gallop and no friction rub.   No murmur heard. Pulmonary/Chest: Effort normal and breath sounds  normal. He has no wheezes. He has no rales. He exhibits no tenderness.  Abdominal: Soft. He exhibits no distension. There is no tenderness. There is no rebound.  Musculoskeletal: Normal range of motion.  Neurological: He is alert and oriented to person, place, and time. Coordination normal.  Speech is goal-oriented. Moves limbs without ataxia.   Skin: Skin is warm and dry.  Psychiatric: He has a normal mood and affect. His behavior is normal.    ED Course  Procedures (including critical care time) Labs Review Labs Reviewed - No data to display Imaging  Review Dg Chest 2 View  07/07/2013   CLINICAL DATA:  Cough, congestion, body aches  EXAM: CHEST  2 VIEW  COMPARISON:  None.  FINDINGS: Cardiomediastinal silhouette is unremarkable. Study is limited by poor inspiration. Streaky bilateral basilar atelectasis or infiltrate.  IMPRESSION: Limited study by poor inspiration. Streaky bilateral basilar atelectasis or infiltrate.   Electronically Signed   By: Natasha Mead M.D.   On: 07/07/2013 20:33    EKG Interpretation   None       MDM   1. CAP (community acquired pneumonia)     9:01 PM Patient is febrile and tachycardic here. Patient given Percocet and robitussin for symptoms. Patient's chest xray shows early pneumonia. Patient will be discharged with Z-pack and hycodan. Patient instructed to return with worsening or concerning symptoms.     Emilia Beck, [place] 07/07/13 2158

## 2013-07-07 NOTE — ED Notes (Signed)
Pt states that he has been having Flu like symptoms for the past couple of days pt has tried OTC medications for cough, sore throat, fever and generalized body aches. Pt states fever has gotten to 102.7 at home.

## 2013-07-08 NOTE — ED Provider Notes (Signed)
Medical screening examination/treatment/procedure(s) were performed by non-physician practitioner and as supervising physician I was immediately available for consultation/collaboration.  EKG Interpretation   None         Charles B. Sheldon, MD 07/08/13 1342 

## 2014-02-16 ENCOUNTER — Other Ambulatory Visit: Payer: Self-pay | Admitting: Family Medicine

## 2014-02-23 ENCOUNTER — Telehealth: Payer: Self-pay | Admitting: Family Medicine

## 2014-02-23 ENCOUNTER — Other Ambulatory Visit: Payer: Self-pay

## 2014-02-23 DIAGNOSIS — I10 Essential (primary) hypertension: Secondary | ICD-10-CM

## 2014-03-14 NOTE — Telephone Encounter (Signed)
Patient has appointment 03-20-14 for med refill

## 2014-03-20 ENCOUNTER — Ambulatory Visit (INDEPENDENT_AMBULATORY_CARE_PROVIDER_SITE_OTHER): Payer: No Typology Code available for payment source | Admitting: Family Medicine

## 2014-03-20 VITALS — BP 153/98 | HR 84 | Temp 98.3°F | Ht 66.0 in | Wt 239.0 lb

## 2014-03-20 DIAGNOSIS — R5383 Other fatigue: Secondary | ICD-10-CM

## 2014-03-20 DIAGNOSIS — E785 Hyperlipidemia, unspecified: Secondary | ICD-10-CM

## 2014-03-20 DIAGNOSIS — R5381 Other malaise: Secondary | ICD-10-CM

## 2014-03-20 DIAGNOSIS — K219 Gastro-esophageal reflux disease without esophagitis: Secondary | ICD-10-CM

## 2014-03-20 DIAGNOSIS — M1990 Unspecified osteoarthritis, unspecified site: Secondary | ICD-10-CM

## 2014-03-20 DIAGNOSIS — R21 Rash and other nonspecific skin eruption: Secondary | ICD-10-CM

## 2014-03-20 DIAGNOSIS — M129 Arthropathy, unspecified: Secondary | ICD-10-CM

## 2014-03-20 DIAGNOSIS — I10 Essential (primary) hypertension: Secondary | ICD-10-CM

## 2014-03-20 DIAGNOSIS — Z139 Encounter for screening, unspecified: Secondary | ICD-10-CM

## 2014-03-20 LAB — POCT CBC
Granulocyte percent: 74.8 % (ref 37–80)
HCT, POC: 45.1 % (ref 43.5–53.7)
Hemoglobin: 14.6 g/dL (ref 14.1–18.1)
Lymph, poc: 2.6 (ref 0.6–3.4)
MCH, POC: 28.2 pg (ref 27–31.2)
MCHC: 32.5 g/dL (ref 31.8–35.4)
MCV: 86.7 fL (ref 80–97)
MPV: 8.8 fL (ref 0–99.8)
POC Granulocyte: 8.4 — AB (ref 2–6.9)
POC LYMPH PERCENT: 23.1 % (ref 10–50)
Platelet Count, POC: 268 10*3/uL (ref 142–424)
RBC: 5.2 M/uL (ref 4.69–6.13)
RDW, POC: 12.3 %
WBC: 11.2 10*3/uL — AB (ref 4.6–10.2)

## 2014-03-20 MED ORDER — OMEPRAZOLE 20 MG PO CPDR: 20.0000 mg | DELAYED_RELEASE_CAPSULE | Freq: Every day | ORAL | Status: DC

## 2014-03-20 MED ORDER — MELOXICAM 7.5 MG PO TABS: 7.5000 mg | ORAL_TABLET | Freq: Every day | ORAL | Status: DC

## 2014-03-20 MED ORDER — MELOXICAM 7.5 MG PO TABS
7.5000 mg | ORAL_TABLET | Freq: Every day | ORAL | Status: DC
Start: 2014-03-20 — End: 2016-08-31

## 2014-03-20 MED ORDER — AMLODIPINE-VALSARTAN-HCTZ 10-160-12.5 MG PO TABS: 1.0000 | ORAL_TABLET | ORAL | Status: DC

## 2014-03-20 MED ORDER — CLOTRIMAZOLE-BETAMETHASONE 1-0.05 % EX CREA: 1.0000 "application " | TOPICAL_CREAM | Freq: Two times a day (BID) | CUTANEOUS | Status: DC

## 2014-03-20 NOTE — Progress Notes (Signed)
   Subjective:    Patient ID: James Griffith, male    DOB: May 18, 1971, 43 y.o.   MRN: [id number]  HPI  This 43 y.o. male presents for evaluation of follow up on hypertension and has a rash on his left eyelid  And left periorbital region.  He has hx of OSA and didn't get appointment to get CPAP.  He did get the  Sleep study but didn't follow up for the cpap. He has hx of GERD.  He has hx of SAR.  Review of Systems C/o snoring and rash   No chest pain, SOB, HA, dizziness, vision change, N/V, diarrhea, constipation, dysuria, urinary urgency or frequency, myalgias, arthralgias or rash.  Objective:   Physical Exam  Vital signs noted  Well developed well nourished male.  HEENT - Head atraumatic Normocephalic                Eyes - PERRLA, Conjuctiva - clear Sclera- Clear EOMI                Ears - EAC's Wnl TM's Wnl Gross Hearing WNL                Nose - Nares patent                 Throat - oropharanx wnl Respiratory - Lungs CTA bilateral Cardiac - RRR S1 and S2 without murmur GI - Abdomen soft Nontender and bowel sounds active x 4 Extremities - No edema. Neuro - Grossly intact. Skin - left eyelid with rash      Assessment & Plan:  Essential hypertension, benign - Plan: POCT CBC, CMP14+EGFR, Amlodipine-Valsartan-HCTZ 10-160-12.5 MG TABS, DISCONTINUED: Amlodipine-Valsartan-HCTZ 10-160-12.5 MG TABS  Gastroesophageal reflux disease without esophagitis - Plan: omeprazole (PRILOSEC) 20 MG capsule, DISCONTINUED: omeprazole (PRILOSEC) 20 MG capsule  Arthritis - Plan: meloxicam (MOBIC) 7.5 MG tablet, DISCONTINUED: meloxicam (MOBIC) 7.5 MG tablet  Hyperlipemia - Plan: CMP14+EGFR, Lipid panel  Other fatigue - Plan: POCT CBC, CMP14+EGFR, Thyroid Panel With TSH, Vit D  25 hydroxy (rtn osteoporosis monitoring)  Screening - Plan: PSA, total and free  Rash and nonspecific skin eruption - Plan: clotrimazole-betamethasone (LOTRISONE) cream  OSA - Recommend follow up for titration study of  sleep test.  Lysbeth Penner FNP

## 2014-03-21 LAB — CMP14+EGFR
ALT: 28 IU/L (ref 0–44)
AST: 26 IU/L (ref 0–40)
Albumin/Globulin Ratio: 1.3 (ref 1.1–2.5)
Albumin: 4.4 g/dL (ref 3.5–5.5)
Alkaline Phosphatase: 105 IU/L (ref 39–117)
BUN/Creatinine Ratio: 7 — ABNORMAL LOW (ref 9–20)
BUN: 13 mg/dL (ref 6–24)
CO2: 24 mmol/L (ref 18–29)
Calcium: 9.2 mg/dL (ref 8.7–10.2)
Chloride: 99 mmol/L (ref 97–108)
Creatinine, Ser: 1.98 mg/dL — ABNORMAL HIGH (ref 0.76–1.27)
GFR calc Af Amer: 46 mL/min/{1.73_m2} — ABNORMAL LOW (ref >59)
GFR calc non Af Amer: 40 mL/min/{1.73_m2} — ABNORMAL LOW (ref >59)
Globulin, Total: 3.4 g/dL (ref 1.5–4.5)
Glucose: 90 mg/dL (ref 65–99)
Potassium: 4.1 mmol/L (ref 3.5–5.2)
Sodium: 141 mmol/L (ref 134–144)
Total Bilirubin: 0.4 mg/dL (ref 0.0–1.2)
Total Protein: 7.8 g/dL (ref 6.0–8.5)

## 2014-03-21 LAB — THYROID PANEL WITH TSH
Free Thyroxine Index: 2.1 (ref 1.2–4.9)
T3 Uptake Ratio: 31 % (ref 24–39)
T4, Total: 6.8 ug/dL (ref 4.5–12.0)
TSH: 2.05 u[IU]/mL (ref 0.450–4.500)

## 2014-03-21 LAB — LIPID PANEL
Chol/HDL Ratio: 4.1 ratio units (ref 0.0–5.0)
Cholesterol, Total: 177 mg/dL (ref 100–199)
HDL: 43 mg/dL (ref >39)
LDL Calculated: 109 mg/dL — ABNORMAL HIGH (ref 0–99)
Triglycerides: 126 mg/dL (ref 0–149)
VLDL Cholesterol Cal: 25 mg/dL (ref 5–40)

## 2014-03-21 LAB — PSA, TOTAL AND FREE
PSA, Free Pct: 30 %
PSA, Free: 0.21 ng/mL
PSA: 0.7 ng/mL (ref 0.0–4.0)

## 2014-03-21 LAB — VITAMIN D 25 HYDROXY (VIT D DEFICIENCY, FRACTURES): Vit D, 25-Hydroxy: 16.8 ng/mL — ABNORMAL LOW (ref 30.0–100.0)

## 2014-03-23 ENCOUNTER — Telehealth: Payer: Self-pay | Admitting: *Deleted

## 2014-03-23 ENCOUNTER — Other Ambulatory Visit: Payer: Self-pay | Admitting: Family Medicine

## 2014-03-23 MED ORDER — VITAMIN D (ERGOCALCIFEROL) 1.25 MG (50000 UNIT) PO CAPS: 50000.0000 [IU] | ORAL_CAPSULE | ORAL | Status: DC

## 2014-03-23 NOTE — Telephone Encounter (Signed)
Message copied by Almeta Monas on Fri Mar 23, 2014 11:10 AM ------      Message from: Deatra Canter      Created: Fri Mar 23, 2014  8:50 AM       Labs show CKD which is stable and need to take bp medicine and keep bp controlled.  Vitamin D is low and rx of vit D sent to pharm.  Ok to take vit D otc 1000 - 2000 otc po qd.  PSA, liver tests were normal.  Lipid panel ok ------

## 2014-03-23 NOTE — Telephone Encounter (Signed)
Call us to discuss lab results. 

## 2014-04-25 ENCOUNTER — Ambulatory Visit (INDEPENDENT_AMBULATORY_CARE_PROVIDER_SITE_OTHER): Payer: No Typology Code available for payment source | Admitting: Family Medicine

## 2014-04-25 ENCOUNTER — Encounter: Payer: Self-pay | Admitting: Family Medicine

## 2014-04-25 VITALS — BP 124/79 | HR 91 | Temp 98.0°F | Ht 66.0 in | Wt 239.2 lb

## 2014-04-25 DIAGNOSIS — I10 Essential (primary) hypertension: Secondary | ICD-10-CM

## 2014-04-25 NOTE — Progress Notes (Signed)
   Subjective:    Patient ID: James Griffith, male    DOB: 02-Jun-1971, 43 y.o.   MRN: [id number]  HPI This 43 y.o. male presents for evaluation of hypertension.   Review of Systems No chest pain, SOB, HA, dizziness, vision change, N/V, diarrhea, constipation, dysuria, urinary urgency or frequency, myalgias, arthralgias or rash.     Objective:   Physical Exam Vital signs noted  Well developed well nourished male.  HEENT - Head atraumatic Normocephalic                Eyes - PERRLA, Conjuctiva - clear Sclera- Clear EOMI                Ears - EAC's Wnl TM's Wnl Gross Hearing WNL                Nose - Nares patent                 Throat - oropharanx wnl Respiratory - Lungs CTA bilateral Cardiac - RRR S1 and S2 without murmur GI - Abdomen soft Nontender and bowel sounds active x 4 Extremities - No edema. Neuro - Grossly intact.       Assessment & Plan:  Essential hypertension - Plan: BMP8+EGFR Continue current meds   Lysbeth Penner FNP

## 2014-04-26 LAB — BMP8+EGFR
BUN/Creatinine Ratio: 7 — ABNORMAL LOW (ref 9–20)
BUN: 13 mg/dL (ref 6–24)
CO2: 23 mmol/L (ref 18–29)
Calcium: 9 mg/dL (ref 8.7–10.2)
Chloride: 96 mmol/L — ABNORMAL LOW (ref 97–108)
Creatinine, Ser: 1.78 mg/dL — ABNORMAL HIGH (ref 0.76–1.27)
GFR calc Af Amer: 53 mL/min/{1.73_m2} — ABNORMAL LOW (ref >59)
GFR calc non Af Amer: 46 mL/min/{1.73_m2} — ABNORMAL LOW (ref >59)
Glucose: 124 mg/dL — ABNORMAL HIGH (ref 65–99)
Potassium: 3.9 mmol/L (ref 3.5–5.2)
Sodium: 139 mmol/L (ref 134–144)

## 2014-05-18 ENCOUNTER — Emergency Department (HOSPITAL_BASED_OUTPATIENT_CLINIC_OR_DEPARTMENT_OTHER)
Admission: EM | Admit: 2014-05-18 | Discharge: 2014-05-18 | Disposition: A | Discharge status: Home / Self Care | Payer: PRIVATE HEALTH INSURANCE | Attending: Emergency Medicine | Admitting: Emergency Medicine

## 2014-05-18 ENCOUNTER — Emergency Department (HOSPITAL_BASED_OUTPATIENT_CLINIC_OR_DEPARTMENT_OTHER): Payer: PRIVATE HEALTH INSURANCE

## 2014-05-18 ENCOUNTER — Encounter (HOSPITAL_BASED_OUTPATIENT_CLINIC_OR_DEPARTMENT_OTHER): Payer: Self-pay

## 2014-05-18 DIAGNOSIS — R05 Cough: Secondary | ICD-10-CM | POA: Diagnosis present

## 2014-05-18 DIAGNOSIS — Z791 Long term (current) use of non-steroidal anti-inflammatories (NSAID): Secondary | ICD-10-CM | POA: Insufficient documentation

## 2014-05-18 DIAGNOSIS — Z8659 Personal history of other mental and behavioral disorders: Secondary | ICD-10-CM | POA: Insufficient documentation

## 2014-05-18 DIAGNOSIS — M1990 Unspecified osteoarthritis, unspecified site: Secondary | ICD-10-CM | POA: Insufficient documentation

## 2014-05-18 DIAGNOSIS — R059 Cough, unspecified: Secondary | ICD-10-CM

## 2014-05-18 DIAGNOSIS — Z7951 Long term (current) use of inhaled steroids: Secondary | ICD-10-CM | POA: Insufficient documentation

## 2014-05-18 DIAGNOSIS — I10 Essential (primary) hypertension: Secondary | ICD-10-CM | POA: Diagnosis not present

## 2014-05-18 DIAGNOSIS — Z79899 Other long term (current) drug therapy: Secondary | ICD-10-CM | POA: Diagnosis not present

## 2014-05-18 DIAGNOSIS — B349 Viral infection, unspecified: Secondary | ICD-10-CM | POA: Diagnosis not present

## 2014-05-18 HISTORY — DX: Other seasonal allergic rhinitis: J30.2

## 2014-05-18 HISTORY — DX: Unspecified osteoarthritis, unspecified site: M19.90

## 2014-05-18 MED ORDER — GUAIFENESIN-CODEINE 100-10 MG/5ML PO SYRP: 5.0000 mL | ORAL_SOLUTION | Freq: Three times a day (TID) | ORAL | Status: DC | PRN

## 2014-05-18 NOTE — ED Notes (Signed)
Wife reports patient has been sick for 1.5 week.  Reports he coughs so much he almost passes out.  Reports patient coughs when he tries to eat and has no lost his voice.  Pt reports coughing up greenish yellow sputum.  Denies fevers.

## 2014-05-18 NOTE — Discharge Instructions (Signed)

## 2014-05-18 NOTE — ED Provider Notes (Signed)
CSN: [id number]     Arrival date & time 05/18/14  [id number] History   First MD Initiated Contact with Patient 05/18/14 1230     Chief Complaint  Patient presents with  . Cough     (Consider location/radiation/quality/duration/timing/severity/associated sxs/prior Treatment) HPI   43 year old male with history of seasonal allergies, anxiety, hypertension presents with persistent cough. Patient states for nearly 2 weeks he has been having nasal congestion, cough productive with yellow sputum, sore throat, and feeling sick. He endorsed chills and hoarseness. Report sometimes he coughs so much he nearly passed out. Report occasional posttussis emesis. He tries taken sinus medication and cough syrup with some relief. 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  He denies fever, wheezes, abdominal cramping, diarrhea, or rash. He is a nonsmoker.  Past Medical History  Diagnosis Date  . Anxiety   . Hypertension   . Arthritis   . Seasonal allergies    History reviewed. No pertinent past surgical history. Family History  Problem Relation Age of Onset  . Diabetes Mother   . Hypertension Mother   . Heart disease Mother   . Diabetes Father   . Hypertension Father   . Heart disease Father    History  Substance Use Topics  . Smoking status: Never Smoker   . Smokeless tobacco: Not on file  . Alcohol Use: No    Review of Systems  All other systems reviewed and are negative.     Allergies  Review of patient's allergies indicates no known allergies.  Home Medications   Prior to Admission medications   Medication Sig Start Date End Date Taking? Authorizing Provider  Amlodipine-Valsartan-HCTZ 10-160-12.5 MG TABS Take 1 capsule by mouth 1 day or 1 dose. 03/20/14   Deatra CanterWilliam J Oxford, FNP  clotrimazole-betamethasone (LOTRISONE) cream Apply 1 application topically 2 (two) times daily. 03/20/14   Deatra CanterWilliam J Oxford, FNP  fluticasone (FLONASE) 50 MCG/ACT nasal spray Place  2 sprays into the nose daily. 02/15/13   Deatra CanterWilliam J Oxford, FNP  meloxicam (MOBIC) 7.5 MG tablet Take 1 tablet (7.5 mg total) by mouth daily. 03/20/14   Deatra CanterWilliam J Oxford, FNP  omeprazole (PRILOSEC) 20 MG capsule Take 1 capsule (20 mg total) by mouth daily. 03/20/14   Deatra CanterWilliam J Oxford, FNP  Vitamin D, Ergocalciferol, (DRISDOL) 50000 UNITS CAPS capsule Take 1 capsule (50,000 Units total) by mouth every 7 (seven) days. 03/23/14   Deatra CanterWilliam J Oxford, FNP   BP 149/103 mmHg  Pulse 73  Temp(Src) 98 F (36.7 C) (Oral)  Resp 20  Ht 5\' 7"  (1.702 m)  Wt 238 lb (107.956 kg)  BMI 37.27 kg/m2  SpO2 97% Physical Exam  Constitutional: He is oriented to person, place, and time. He appears well-developed and well-nourished. No distress.  HENT:  Head: Atraumatic.  Right Ear: External ear normal.  Left Ear: External ear normal.  Nose: Nose normal.  Mouth/Throat: Oropharynx is clear and moist.  Eyes: Conjunctivae are normal.  Neck: Normal range of motion. Neck supple. No JVD present.  Cardiovascular: Normal rate and regular rhythm.   Pulmonary/Chest: Effort normal and breath sounds normal. No respiratory distress. He has no wheezes. He exhibits no tenderness.  Abdominal: Soft. There is no tenderness.  Musculoskeletal: He exhibits no edema.  Neurological: He is alert and oriented to person, place, and time.  Skin: No rash noted.  Psychiatric: He has a normal mood and affect.    ED Course  Procedures (including critical care time)  12:59  PM Patient here with URI symptoms. His chest x-ray shows no evidence of pneumonia. He is afebrile with stable normal vital signs. Will provide cough medication with codeine for symptomatically treatment. Return precaution given. Low suspicion for cardiac disease, and low suspicion for PE. He is currently not taking any ACE inhibitor.  Labs Review Labs Reviewed - No data to display  Imaging Review Dg Chest 2 View  05/18/2014   CLINICAL DATA:  Initial encounter for 2  week history of cough and congestion.  EXAM: CHEST  2 VIEW  COMPARISON:  07/07/2013.  FINDINGS: Lung volumes are low without focal airspace consolidation or pulmonary edema. No pneumothorax or pleural effusion. The cardiopericardial silhouette is within normal limits for size. Imaged bony structures of the thorax are intact.  IMPRESSION: Low volume film without acute cardiopulmonary process.   Electronically Signed   By: Kennith CenterEric  Mansell M.D.   On: 05/18/2014 10:37     EKG Interpretation None      MDM   Final diagnoses:  Viral syndrome    BP 149/103 mmHg  Pulse 73  Temp(Src) 98 F (36.7 C) (Oral)  Resp 20  Ht 5\' 7"  (1.702 m)  Wt 238 lb (107.956 kg)  BMI 37.27 kg/m2  SpO2 97%  I have reviewed nursing notes and vital signs. I personally reviewed the imaging tests through PACS system  I reviewed available ER/hospitalization records thought the EMR     Fayrene HelperBowie Kathye Cipriani, PA-C 05/18/14 1302  Rolan BuccoMelanie Belfi, MD 05/18/14 1538

## 2014-08-07 ENCOUNTER — Emergency Department (HOSPITAL_BASED_OUTPATIENT_CLINIC_OR_DEPARTMENT_OTHER): Payer: PRIVATE HEALTH INSURANCE

## 2014-08-07 ENCOUNTER — Emergency Department (HOSPITAL_BASED_OUTPATIENT_CLINIC_OR_DEPARTMENT_OTHER)
Admission: EM | Admit: 2014-08-07 | Discharge: 2014-08-07 | Disposition: A | Discharge status: Home / Self Care | Payer: PRIVATE HEALTH INSURANCE | Attending: Emergency Medicine | Admitting: Emergency Medicine

## 2014-08-07 ENCOUNTER — Encounter (HOSPITAL_BASED_OUTPATIENT_CLINIC_OR_DEPARTMENT_OTHER): Payer: Self-pay

## 2014-08-07 DIAGNOSIS — H9209 Otalgia, unspecified ear: Secondary | ICD-10-CM | POA: Diagnosis not present

## 2014-08-07 DIAGNOSIS — I10 Essential (primary) hypertension: Secondary | ICD-10-CM | POA: Insufficient documentation

## 2014-08-07 DIAGNOSIS — Z8659 Personal history of other mental and behavioral disorders: Secondary | ICD-10-CM | POA: Insufficient documentation

## 2014-08-07 DIAGNOSIS — R05 Cough: Secondary | ICD-10-CM | POA: Diagnosis present

## 2014-08-07 DIAGNOSIS — J40 Bronchitis, not specified as acute or chronic: Secondary | ICD-10-CM | POA: Insufficient documentation

## 2014-08-07 DIAGNOSIS — Z79899 Other long term (current) drug therapy: Secondary | ICD-10-CM | POA: Diagnosis not present

## 2014-08-07 DIAGNOSIS — M1990 Unspecified osteoarthritis, unspecified site: Secondary | ICD-10-CM | POA: Diagnosis not present

## 2014-08-07 MED ORDER — ALBUTEROL SULFATE HFA 108 (90 BASE) MCG/ACT IN AERS: 1.0000 | INHALATION_SPRAY | Freq: Four times a day (QID) | RESPIRATORY_TRACT | Status: DC | PRN

## 2014-08-07 MED ORDER — AZITHROMYCIN 250 MG PO TABS: ORAL_TABLET | ORAL | Status: DC

## 2014-08-07 MED ORDER — PREDNISONE 10 MG PO TABS: 20.0000 mg | ORAL_TABLET | Freq: Every day | ORAL | Status: DC

## 2014-08-07 NOTE — Discharge Instructions (Signed)

## 2014-08-07 NOTE — ED Notes (Signed)
Intermittent prod cough x 1 week

## 2014-08-07 NOTE — ED Provider Notes (Signed)
CSN: [id number]     Arrival date & time 08/07/14  2001 History  This chart was scribed for Toy BakerAnthony T Kadince Boxley, MD by Freida Busmaniana Omoyeni, ED Scribe. This patient was seen in room MH12/MH12 and the patient's care was started 8:47 PM.    Chief Complaint  Patient presents with  . Cough      The history is provided by the patient. No language interpreter was used.   HPI Comments:  James Griffith is a 44 y.o. male who presents to the Emergency Department complaining of a productive cough for 1 week. He notes green, yellow and brown sputum with his cough. He reports associated mild ear pain and occasional post-tussis vomiting.  He notes his cough is worse at night. He has been taking cough syrup and mucinex with little relief. He denies h/o asthma but reports a h/o the same almost yearly.   Past Medical History  Diagnosis Date  . Anxiety   . Hypertension   . Arthritis   . Seasonal allergies    History reviewed. No pertinent past surgical history. Family History  Problem Relation Age of Onset  . Diabetes Mother   . Hypertension Mother   . Heart disease Mother   . Diabetes Father   . Hypertension Father   . Heart disease Father    History  Substance Use Topics  . Smoking status: Never Smoker   . Smokeless tobacco: Not on file  . Alcohol Use: No    Review of Systems  HENT: Positive for ear pain.   Respiratory: Positive for cough and wheezing.   All other systems reviewed and are negative.     Allergies  Review of patient's allergies indicates no known allergies.  Home Medications   Prior to Admission medications   Medication Sig Start Date End Date Taking? Authorizing Provider  Amlodipine-Valsartan-HCTZ 10-160-12.5 MG TABS Take 1 capsule by mouth 1 day or 1 dose. 03/20/14   Deatra CanterWilliam J Oxford, FNP  clotrimazole-betamethasone (LOTRISONE) cream Apply 1 application topically 2 (two) times daily. 03/20/14   Deatra CanterWilliam J Oxford, FNP  fluticasone (FLONASE) 50 MCG/ACT nasal spray Place 2  sprays into the nose daily. 02/15/13   Deatra CanterWilliam J Oxford, FNP  guaiFENesin-codeine (CHERATUSSIN AC) 100-10 MG/5ML syrup Take 5 mLs by mouth 3 (three) times daily as needed for cough. 05/18/14   Fayrene HelperBowie Tran, PA-C  meloxicam (MOBIC) 7.5 MG tablet Take 1 tablet (7.5 mg total) by mouth daily. 03/20/14   Deatra CanterWilliam J Oxford, FNP  omeprazole (PRILOSEC) 20 MG capsule Take 1 capsule (20 mg total) by mouth daily. 03/20/14   Deatra CanterWilliam J Oxford, FNP  Vitamin D, Ergocalciferol, (DRISDOL) 50000 UNITS CAPS capsule Take 1 capsule (50,000 Units total) by mouth every 7 (seven) days. 03/23/14   Deatra CanterWilliam J Oxford, FNP   BP 169/100 mmHg  Pulse 110  Temp(Src) 100.1 F (37.8 C) (Oral)  Resp 20  Ht 5\' 7"  (1.702 m)  Wt 240 lb (108.863 kg)  BMI 37.58 kg/m2  SpO2 95% Physical Exam  Constitutional: He is oriented to person, place, and time. He appears well-developed and well-nourished.  Non-toxic appearance. No distress.  HENT:  Head: Normocephalic and atraumatic.  Right Ear: External ear normal.  Left Ear: External ear normal.  Mouth/Throat: Oropharynx is clear and moist. No oropharyngeal exudate.  Eyes: Conjunctivae, EOM and lids are normal. Pupils are equal, round, and reactive to light.  Neck: Normal range of motion. Neck supple. No tracheal deviation present. No thyroid mass present.  Cardiovascular: Normal rate,  regular rhythm and normal heart sounds.  Exam reveals no gallop.   No murmur heard. Pulmonary/Chest: Effort normal and breath sounds normal. No stridor. No respiratory distress. He has no decreased breath sounds. He has no wheezes. He has no rhonchi. He has no rales.  Abdominal: Soft. Normal appearance and bowel sounds are normal. He exhibits no distension. There is no tenderness. There is no rebound and no CVA tenderness.  Musculoskeletal: Normal range of motion. He exhibits no edema or tenderness.  Neurological: He is alert and oriented to person, place, and time. He has normal strength. No cranial nerve  deficit or sensory deficit. GCS eye subscore is 4. GCS verbal subscore is 5. GCS motor subscore is 6.  Skin: Skin is warm and dry. No abrasion and no rash noted.  Psychiatric: He has a normal mood and affect. His speech is normal and behavior is normal.  Nursing note and vitals reviewed.   ED Course  Procedures   DIAGNOSTIC STUDIES:  Oxygen Saturation is 95% on RA, adequate by my interpretation.    COORDINATION OF CARE:  8:50 PM Will discharge with inhaler and short course of prednisone Discussed treatment plan with pt at bedside and pt agreed to plan.  Labs Review Labs Reviewed - No data to display  Imaging Review Dg Chest 2 View  08/07/2014   CLINICAL DATA:  Cough  EXAM: CHEST  2 VIEW  COMPARISON:  05/18/2014  FINDINGS: Low lung volumes with atelectasis at the left base. There is no edema, consolidation, effusion, or pneumothorax. Normal heart size and mediastinal contours.  IMPRESSION: Low lung volumes with basilar atelectasis.   Electronically Signed   By: Marnee Spring M.D.   On: 08/07/2014 20:37     EKG Interpretation None      MDM   Final diagnoses:  None    I personally performed the services described in this documentation, which was scribed in my presence. The recorded information has been reviewed and is accurate.  Pt to be treated for bronchitis     Toy Baker, MD 08/10/14 [phone]

## 2014-09-22 ENCOUNTER — Other Ambulatory Visit: Payer: Self-pay | Admitting: Family Medicine

## 2015-01-02 ENCOUNTER — Other Ambulatory Visit: Payer: Self-pay

## 2015-01-02 ENCOUNTER — Telehealth: Payer: Self-pay | Admitting: Family Medicine

## 2015-01-02 DIAGNOSIS — I10 Essential (primary) hypertension: Secondary | ICD-10-CM

## 2015-01-02 MED ORDER — AMLODIPINE-VALSARTAN-HCTZ 10-160-12.5 MG PO TABS: ORAL_TABLET | ORAL | Status: DC

## 2015-01-10 ENCOUNTER — Ambulatory Visit (INDEPENDENT_AMBULATORY_CARE_PROVIDER_SITE_OTHER): Payer: No Typology Code available for payment source

## 2015-01-10 ENCOUNTER — Encounter: Payer: Self-pay | Admitting: Family Medicine

## 2015-01-10 ENCOUNTER — Ambulatory Visit (INDEPENDENT_AMBULATORY_CARE_PROVIDER_SITE_OTHER): Payer: No Typology Code available for payment source | Admitting: Family Medicine

## 2015-01-10 VITALS — BP 142/95 | HR 85 | Temp 98.1°F | Ht 67.0 in | Wt 246.0 lb

## 2015-01-10 DIAGNOSIS — M5442 Lumbago with sciatica, left side: Secondary | ICD-10-CM

## 2015-01-10 DIAGNOSIS — I10 Essential (primary) hypertension: Secondary | ICD-10-CM | POA: Diagnosis not present

## 2015-01-10 MED ORDER — CYCLOBENZAPRINE HCL 10 MG PO TABS: 10.0000 mg | ORAL_TABLET | Freq: Every day | ORAL | Status: DC

## 2015-01-10 MED ORDER — AMLODIPINE-VALSARTAN-HCTZ 10-160-12.5 MG PO TABS: 1.0000 | ORAL_TABLET | Freq: Every day | ORAL | Status: DC

## 2015-01-10 NOTE — Patient Instructions (Signed)

## 2015-01-11 ENCOUNTER — Encounter: Payer: Self-pay | Admitting: Family Medicine

## 2015-01-11 DIAGNOSIS — M1990 Unspecified osteoarthritis, unspecified site: Secondary | ICD-10-CM | POA: Insufficient documentation

## 2015-01-11 DIAGNOSIS — I10 Essential (primary) hypertension: Secondary | ICD-10-CM | POA: Insufficient documentation

## 2015-01-11 DIAGNOSIS — F419 Anxiety disorder, unspecified: Secondary | ICD-10-CM | POA: Insufficient documentation

## 2015-01-11 DIAGNOSIS — K219 Gastro-esophageal reflux disease without esophagitis: Secondary | ICD-10-CM | POA: Insufficient documentation

## 2015-01-11 HISTORY — DX: Essential (primary) hypertension: I10

## 2015-01-11 NOTE — Progress Notes (Signed)
   Subjective:    Patient ID: James Griffith, male    DOB: August 27, 1970, 44 y.o.   MRN: [id number]  HPI  44 year old gentleman who had run out of his blood pressure pills. He found a few more and started back on them recently. Blood pressure had been controlled when he takes his pills.  He also complains today of some back pain worse on the left side with some radiation to the left leg. He has never been evaluated for back pain. It is intermittent    Review of Systems  Constitutional: Negative.   HENT: Negative.   Eyes: Negative.   Respiratory: Negative.  Negative for shortness of breath.   Cardiovascular: Negative.  Negative for chest pain and leg swelling.  Gastrointestinal: Negative.   Genitourinary: Negative.   Musculoskeletal: Positive for back pain.  Skin: Negative.   Neurological: Negative.   Psychiatric/Behavioral: Negative.   All other systems reviewed and are negative.  There are no active problems to display for this patient.  Outpatient Encounter Prescriptions as of 01/10/2015  Medication Sig  . albuterol (PROVENTIL HFA;VENTOLIN HFA) 108 (90 BASE) MCG/ACT inhaler Inhale 1-2 puffs into the lungs every 6 (six) hours as needed for wheezing or shortness of breath.  . Amlodipine-Valsartan-HCTZ 10-160-12.5 MG TABS Take 1 capsule by mouth daily.  . clotrimazole-betamethasone (LOTRISONE) cream Apply 1 application topically 2 (two) times daily.  . fluticasone (FLONASE) 50 MCG/ACT nasal spray Place 2 sprays into the nose daily.  . meloxicam (MOBIC) 7.5 MG tablet Take 1 tablet (7.5 mg total) by mouth daily.  Marland Kitchen. omeprazole (PRILOSEC) 20 MG capsule Take 1 capsule (20 mg total) by mouth daily.  . [DISCONTINUED] Amlodipine-Valsartan-HCTZ 10-160-12.5 MG TABS TAKE 1 CAPSULE BY MOUTH 1 DAY OR 1 DOSE.  . cyclobenzaprine (FLEXERIL) 10 MG tablet Take 1 tablet (10 mg total) by mouth at bedtime.  . [DISCONTINUED] Amlodipine-Valsartan-HCTZ 10-160-12.5 MG TABS TAKE ONE DAILY  . [DISCONTINUED]  azithromycin (ZITHROMAX Z-PAK) 250 MG tablet 2 po qd x1, then 1 po qd x 4 day  . [DISCONTINUED] guaiFENesin-codeine (CHERATUSSIN AC) 100-10 MG/5ML syrup Take 5 mLs by mouth 3 (three) times daily as needed for cough.  . [DISCONTINUED] omeprazole (PRILOSEC) 20 MG capsule TAKE 1 CAPSULE (20 MG TOTAL) BY MOUTH DAILY.  . [DISCONTINUED] predniSONE (DELTASONE) 10 MG tablet Take 2 tablets (20 mg total) by mouth daily.  . [DISCONTINUED] Vitamin D, Ergocalciferol, (DRISDOL) 50000 UNITS CAPS capsule Take 1 capsule (50,000 Units total) by mouth every 7 (seven) days.   No facility-administered encounter medications on file as of 01/10/2015.       Objective:   Physical Exam  Constitutional: He appears well-developed and well-nourished.  Cardiovascular: Normal rate and regular rhythm.   Pulmonary/Chest: Effort normal and breath sounds normal.  Musculoskeletal:  Back exam: Normal range of motion. Straight leg raising is negative. Deep tendon reflexes are symmetric at the knee and ankle  X-ray shows some loss of volume and L5 we will see what radiologist has to say about same but disc spaces are well-maintained          Assessment & Plan:  1. Midline low back pain with left-sided sciatica No evidence of radiculopathy. Will try Flexeril at bedtime along with back exercises and massage with bio freeze - DG Lumbar Spine 2-3 Views; Future   2. Essential hypertension Patient to continue blood pressure medicine. Wife monitors pressure goal is 135/85 or below.  Frederica KusterStephen M Miller MD

## 2015-02-02 ENCOUNTER — Emergency Department
Admission: EM | Admit: 2015-02-02 | Discharge: 2015-02-03 | Disposition: A | Discharge status: Home / Self Care | Payer: PRIVATE HEALTH INSURANCE | Attending: Emergency Medicine | Admitting: Emergency Medicine

## 2015-02-02 ENCOUNTER — Encounter: Payer: Self-pay | Admitting: Emergency Medicine

## 2015-02-02 ENCOUNTER — Emergency Department: Payer: PRIVATE HEALTH INSURANCE

## 2015-02-02 DIAGNOSIS — Z791 Long term (current) use of non-steroidal anti-inflammatories (NSAID): Secondary | ICD-10-CM | POA: Insufficient documentation

## 2015-02-02 DIAGNOSIS — S52502A Unspecified fracture of the lower end of left radius, initial encounter for closed fracture: Secondary | ICD-10-CM | POA: Diagnosis not present

## 2015-02-02 DIAGNOSIS — Z79899 Other long term (current) drug therapy: Secondary | ICD-10-CM | POA: Insufficient documentation

## 2015-02-02 DIAGNOSIS — S5292XA Unspecified fracture of left forearm, initial encounter for closed fracture: Secondary | ICD-10-CM

## 2015-02-02 DIAGNOSIS — Y92480 Sidewalk as the place of occurrence of the external cause: Secondary | ICD-10-CM | POA: Diagnosis not present

## 2015-02-02 DIAGNOSIS — Y9355 Activity, bike riding: Secondary | ICD-10-CM | POA: Insufficient documentation

## 2015-02-02 DIAGNOSIS — Y998 Other external cause status: Secondary | ICD-10-CM | POA: Insufficient documentation

## 2015-02-02 DIAGNOSIS — Z7951 Long term (current) use of inhaled steroids: Secondary | ICD-10-CM | POA: Diagnosis not present

## 2015-02-02 DIAGNOSIS — S6992XA Unspecified injury of left wrist, hand and finger(s), initial encounter: Secondary | ICD-10-CM | POA: Diagnosis present

## 2015-02-02 DIAGNOSIS — I10 Essential (primary) hypertension: Secondary | ICD-10-CM | POA: Diagnosis not present

## 2015-02-02 MED ORDER — OXYCODONE-ACETAMINOPHEN 5-325 MG PO TABS
1.0000 | ORAL_TABLET | Freq: Once | ORAL | Status: AC
  Administered 2015-02-02: 1 via ORAL

## 2015-02-02 MED ORDER — OXYCODONE-ACETAMINOPHEN 5-325 MG PO TABS
ORAL_TABLET | ORAL | Status: AC
  Administered 2015-02-02: 1 via ORAL
  Filled 2015-02-02: qty 1
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
## 2015-02-03 ENCOUNTER — Encounter (HOSPITAL_COMMUNITY): Payer: Self-pay | Admitting: *Deleted

## 2015-02-03 ENCOUNTER — Emergency Department (HOSPITAL_COMMUNITY)
Admission: EM | Admit: 2015-02-03 | Discharge: 2015-02-03 | Disposition: A | Discharge status: Home / Self Care | Payer: PRIVATE HEALTH INSURANCE | Attending: Emergency Medicine | Admitting: Emergency Medicine

## 2015-02-03 DIAGNOSIS — M1990 Unspecified osteoarthritis, unspecified site: Secondary | ICD-10-CM | POA: Diagnosis not present

## 2015-02-03 DIAGNOSIS — M25532 Pain in left wrist: Secondary | ICD-10-CM | POA: Diagnosis present

## 2015-02-03 DIAGNOSIS — Z7951 Long term (current) use of inhaled steroids: Secondary | ICD-10-CM | POA: Diagnosis not present

## 2015-02-03 DIAGNOSIS — I10 Essential (primary) hypertension: Secondary | ICD-10-CM | POA: Insufficient documentation

## 2015-02-03 DIAGNOSIS — S52502A Unspecified fracture of the lower end of left radius, initial encounter for closed fracture: Secondary | ICD-10-CM | POA: Diagnosis not present

## 2015-02-03 DIAGNOSIS — K219 Gastro-esophageal reflux disease without esophagitis: Secondary | ICD-10-CM | POA: Insufficient documentation

## 2015-02-03 DIAGNOSIS — Z79899 Other long term (current) drug therapy: Secondary | ICD-10-CM | POA: Insufficient documentation

## 2015-02-03 DIAGNOSIS — Z8659 Personal history of other mental and behavioral disorders: Secondary | ICD-10-CM | POA: Diagnosis not present

## 2015-02-03 DIAGNOSIS — S5292XD Unspecified fracture of left forearm, subsequent encounter for closed fracture with routine healing: Secondary | ICD-10-CM | POA: Diagnosis not present

## 2015-02-03 MED ORDER — HYDROMORPHONE HCL 1 MG/ML IJ SOLN
1.0000 mg | Freq: Once | INTRAMUSCULAR | Status: AC
  Administered 2015-02-03: 1 mg via INTRAMUSCULAR
  Filled 2015-02-03: qty 1

## 2015-02-03 MED ORDER — OXYCODONE-ACETAMINOPHEN 5-325 MG PO TABS
1.0000 | ORAL_TABLET | Freq: Once | ORAL | Status: AC
  Administered 2015-02-03: 1 via ORAL

## 2015-02-03 MED ORDER — OXYCODONE-ACETAMINOPHEN 5-325 MG PO TABS: 1.0000 | ORAL_TABLET | ORAL | Status: DC | PRN

## 2015-02-03 MED ORDER — OXYCODONE-ACETAMINOPHEN 5-325 MG PO TABS
ORAL_TABLET | ORAL | Status: AC
  Administered 2015-02-03: 1 via ORAL
  Filled 2015-02-03: qty 1
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
## 2015-02-03 NOTE — ED Notes (Signed)
Patient with no complaints at this time. Respirations even and unlabored. Skin warm/dry. Discharge instructions reviewed with patient at this time. Patient given opportunity to voice concerns/ask questions. Patient discharged at this time and left Emergency Department with steady gait.   
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
## 2015-02-03 NOTE — Discharge Instructions (Signed)
Cast or Splint Care Return for any numbness or tingling, pallor to the hand or arm, diminished sensation. Follow-up with [facility] orthopedics. Take Percocet for pain. Casts and splints support injured limbs and keep bones from moving while they heal. It is important to care for your cast or splint at home.  HOME CARE INSTRUCTIONS  Keep the cast or splint uncovered during the drying period. It can take 24 to 48 hours to dry if it is made of plaster. A fiberglass cast will dry in less than 1 hour.  Do not rest the cast on anything harder than a pillow for the first 24 hours.  Do not put weight on your injured limb or apply pressure to the cast until your health care provider gives you permission.  Keep the cast or splint dry. Wet casts or splints can lose their shape and may not support the limb as well. A wet cast that has lost its shape can also create harmful pressure on your skin when it dries. Also, wet skin can become infected.  Cover the cast or splint with a plastic bag when bathing or when out in the rain or snow. If the cast is on the trunk of the body, take sponge baths until the cast is removed.  If your cast does become wet, dry it with a towel or a blow dryer on the cool setting only.  Keep your cast or splint clean. Soiled casts may be wiped with a moistened cloth.  Do not place any hard or soft foreign objects under your cast or splint, such as cotton, toilet paper, lotion, or powder.  Do not try to scratch the skin under the cast with any object. The object could get stuck inside the cast. Also, scratching could lead to an infection. If itching is a problem, use a blow dryer on a cool setting to relieve discomfort.  Do not trim or cut your cast or remove padding from inside of it.  Exercise all joints next to the injury that are not immobilized by the cast or splint. For example, if you have a long leg cast, exercise the hip joint and toes. If you have an arm cast or  splint, exercise the shoulder, elbow, thumb, and fingers.  Elevate your injured arm or leg on 1 or 2 pillows for the first 1 to 3 days to decrease swelling and pain.It is best if you can comfortably elevate your cast so it is higher than your heart. SEEK MEDICAL CARE IF:   Your cast or splint cracks.  Your cast or splint is too tight or too loose.  You have unbearable itching inside the cast.  Your cast becomes wet or develops a soft spot or area.  You have a bad smell coming from inside your cast.  You get an object stuck under your cast.  Your skin around the cast becomes red or raw.  You have new pain or worsening pain after the cast has been applied. SEEK IMMEDIATE MEDICAL CARE IF:   You have fluid leaking through the cast.  You are unable to move your fingers or toes.  You have discolored (blue or white), cool, painful, or very swollen fingers or toes beyond the cast.  You have tingling or numbness around the injured area.  You have severe pain or pressure under the cast.  You have any difficulty with your breathing or have shortness of breath.  You have chest pain. Document Released: 06/12/2000 Document Revised: 04/05/2013 Document Reviewed:  12/22/2012 [employer] Patient Information 2015 [place] [place]. This information is not intended to replace advice given to you by your health care provider. Make sure you discuss any questions you have with your health care provider.

## 2015-02-03 NOTE — Discharge Instructions (Signed)
Please seek medical attention for any high fevers, chest pain, shortness of breath, change in behavior, persistent vomiting, bloody stool or any other new or concerning symptoms.  Radial Fracture You have a broken bone (fracture) of the forearm. This is the part of your arm between the elbow and your wrist. Your forearm is made up of two bones. These are the radius and ulna. Your fracture is in the radial shaft. This is the bone in your forearm located on the thumb side. A cast or splint is used to protect and keep your injured bone from moving. The cast or splint will be on generally for about 5 to 6 weeks, with individual variations. HOME CARE INSTRUCTIONS   Keep the injured part elevated while sitting or lying down. Keep the injury above the level of your heart (the center of the chest). This will decrease swelling and pain.  Apply ice to the injury for 15-20 minutes, 03-04 times per day while awake, for 2 days. Put the ice in a plastic bag and place a towel between the bag of ice and your cast or splint.  Move your fingers to avoid stiffness and minimize swelling.  If you have a plaster or fiberglass cast:  Do not try to scratch the skin under the cast using sharp or pointed objects.  Check the skin around the cast every day. You may put lotion on any red or sore areas.  Keep your cast dry and clean.  If you have a plaster splint:  Wear the splint as directed.  You may loosen the elastic around the splint if your fingers become numb, tingle, or turn cold or blue.  Do not put pressure on any part of your cast or splint. It may break. Rest your cast only on a pillow for the first 24 hours until it is fully hardened.  Your cast or splint can be protected during bathing with a plastic bag. Do not lower the cast or splint into water.  Only take over-the-counter or prescription medicines for pain, discomfort, or fever as directed by your caregiver. SEEK IMMEDIATE MEDICAL CARE IF:    Your cast gets damaged or breaks.  You have more severe pain or swelling than you did before getting the cast.  You have severe pain when stretching your fingers.  There is a bad smell, new stains and/or pus-like (purulent) drainage coming from under the cast.  Your fingers or hand turn pale or blue and become cold or your loose feeling. Document Released: 11/26/2005 Document Revised: 09/07/2011 Document Reviewed: 02/22/2006 [facility] Patient Information 2015 [place], [place]. This information is not intended to replace advice given to you by your health care provider. Make sure you discuss any questions you have with your health care provider.
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
## 2015-02-04 HISTORY — PX: WRIST FRACTURE SURGERY: SHX121

## 2015-04-09 ENCOUNTER — Telehealth: Payer: Self-pay | Admitting: Family Medicine

## 2015-04-10 NOTE — Telephone Encounter (Signed)
appt made

## 2015-04-16 ENCOUNTER — Encounter: Payer: Self-pay | Admitting: Family Medicine

## 2015-04-16 ENCOUNTER — Ambulatory Visit (INDEPENDENT_AMBULATORY_CARE_PROVIDER_SITE_OTHER): Payer: No Typology Code available for payment source | Admitting: Family Medicine

## 2015-04-16 VITALS — BP 112/81 | HR 93 | Temp 98.2°F | Ht 67.0 in | Wt 244.0 lb

## 2015-04-16 DIAGNOSIS — R5382 Chronic fatigue, unspecified: Secondary | ICD-10-CM | POA: Diagnosis not present

## 2015-04-16 NOTE — Progress Notes (Signed)
   Subjective:    Patient ID: James Griffith, male    DOB: June 25, 1971, 44 y.o.   MRN: [id number]  HPI    Review of Systems     Objective:   Physical Exam        Assessment & Plan:  112

## 2015-04-16 NOTE — Progress Notes (Signed)
   Subjective:    Patient ID: James Griffith, male    DOB: [birth year], 44 y.o.   MRN: [id number]  HPI  44 year old gentleman who is here primarily to check his blood pressure, which is doing great now that he's taking his medicines. His wife who accompanies him today is concerned that he gets filled earlier when eating. There is no vomiting or dysphagia just early satiety. Given that he has not lost weight. There is no pain. He does have a history of GERD as well as some chronic cough.   Review of Systems  Constitutional: Positive for fatigue.  Respiratory: Positive for cough.   Cardiovascular: Negative.   Neurological: Negative.   Psychiatric/Behavioral: Negative.        Patient Active Problem List   Diagnosis Date Noted  . Essential hypertension 01/11/2015  . Hypertension   . Anxiety   . GERD (gastroesophageal reflux disease)   . Arthritis    Outpatient Encounter Prescriptions as of 04/16/2015  Medication Sig  . albuterol (PROVENTIL HFA;VENTOLIN HFA) 108 (90 BASE) MCG/ACT inhaler Inhale 1-2 puffs into the lungs every 6 (six) hours as needed for wheezing or shortness of breath.  . Amlodipine-Valsartan-HCTZ 10-160-12.5 MG TABS Take 1 capsule by mouth daily.  . clotrimazole-betamethasone (LOTRISONE) cream Apply 1 application topically 2 (two) times daily.  . cyclobenzaprine (FLEXERIL) 10 MG tablet Take 1 tablet (10 mg total) by mouth at bedtime.  . fluticasone (FLONASE) 50 MCG/ACT nasal spray Place 2 sprays into the nose daily.  . meloxicam (MOBIC) 7.5 MG tablet Take 1 tablet (7.5 mg total) by mouth daily.  Marland Kitchen omeprazole (PRILOSEC) 20 MG capsule Take 1 capsule (20 mg total) by mouth daily.  Marland Kitchen oxyCODONE-acetaminophen (ROXICET) 5-325 MG per tablet Take 1 tablet by mouth every 4 (four) hours as needed for severe pain.   No facility-administered encounter medications on file as of 04/16/2015.    Objective:   Physical Exam  Constitutional: He is oriented to person, place, and time. He  appears well-developed and well-nourished.  HENT:  Head: Normocephalic.  Cardiovascular: Normal rate and regular rhythm.   Pulmonary/Chest: Effort normal and breath sounds normal.  Abdominal: Soft. There is no tenderness.  Neurological: He is alert and oriented to person, place, and time.  Psychiatric: He has a normal mood and affect.          Assessment & Plan:  1. Chronic fatigue This symptom was endorsed by his wife rather than the patient. There is a suggestion of early satiety is puzzling since he has not lost weight. There is also some chronic cough that may be related to postnasal drainage asthma and/or reflux. I suggested to use albuterol and omeprazole on a regular basis to see if the cough improves. We'll check some basic lab work today for malaise and fatigue but he may need to see GI if symptoms persist and there is any weight loss  Wardell Honour MD - CMP14+EGFR - CBC with Differential/Platelet

## 2015-04-17 LAB — CMP14+EGFR
ALK PHOS: 104 IU/L (ref 39–117)
ALT: 23 IU/L (ref 0–44)
AST: 20 IU/L (ref 0–40)
Albumin/Globulin Ratio: 1.2 (ref 1.1–2.5)
Albumin: 4.4 g/dL (ref 3.5–5.5)
BILIRUBIN TOTAL: 0.6 mg/dL (ref 0.0–1.2)
BUN/Creatinine Ratio: 9 (ref 9–20)
BUN: 18 mg/dL (ref 6–24)
CHLORIDE: 95 mmol/L — AB (ref 97–106)
CO2: 26 mmol/L (ref 18–29)
CREATININE: 1.9 mg/dL — AB (ref 0.76–1.27)
Calcium: 9.1 mg/dL (ref 8.7–10.2)
GFR calc Af Amer: 48 mL/min/{1.73_m2} — ABNORMAL LOW (ref >59)
GFR calc non Af Amer: 42 mL/min/{1.73_m2} — ABNORMAL LOW (ref >59)
GLUCOSE: 156 mg/dL — AB (ref 65–99)
Globulin, Total: 3.7 g/dL (ref 1.5–4.5)
Potassium: 3.8 mmol/L (ref 3.5–5.2)
SODIUM: 136 mmol/L (ref 136–144)
Total Protein: 8.1 g/dL (ref 6.0–8.5)

## 2015-04-17 LAB — CBC WITH DIFFERENTIAL/PLATELET
BASOS: 1 %
Basophils Absolute: 0.1 10*3/uL (ref 0.0–0.2)
EOS (ABSOLUTE): 0.3 10*3/uL (ref 0.0–0.4)
EOS: 2 %
HEMOGLOBIN: 14.8 g/dL (ref 12.6–17.7)
Hematocrit: 43 % (ref 37.5–51.0)
IMMATURE GRANS (ABS): 0 10*3/uL (ref 0.0–0.1)
IMMATURE GRANULOCYTES: 0 %
LYMPHS: 33 %
Lymphocytes Absolute: 3.5 10*3/uL — ABNORMAL HIGH (ref 0.7–3.1)
MCH: 29.7 pg (ref 26.6–33.0)
MCHC: 34.4 g/dL (ref 31.5–35.7)
MCV: 86 fL (ref 79–97)
MONOCYTES: 5 %
Monocytes Absolute: 0.6 10*3/uL (ref 0.1–0.9)
NEUTROS ABS: 6.2 10*3/uL (ref 1.4–7.0)
NEUTROS PCT: 59 %
PLATELETS: 323 10*3/uL (ref 150–379)
RBC: 4.98 x10E6/uL (ref 4.14–5.80)
RDW: 13.4 % (ref 12.3–15.4)
WBC: 10.5 10*3/uL (ref 3.4–10.8)

## 2015-04-18 ENCOUNTER — Telehealth: Payer: Self-pay | Admitting: Family Medicine

## 2015-04-18 ENCOUNTER — Other Ambulatory Visit: Payer: Self-pay | Admitting: *Deleted

## 2015-04-18 DIAGNOSIS — R7989 Other specified abnormal findings of blood chemistry: Secondary | ICD-10-CM

## 2015-04-18 NOTE — Telephone Encounter (Signed)
called

## 2015-04-24 ENCOUNTER — Encounter: Payer: Self-pay | Admitting: Family Medicine

## 2015-06-03 ENCOUNTER — Other Ambulatory Visit (HOSPITAL_COMMUNITY): Payer: Self-pay | Admitting: Nephrology

## 2015-06-03 DIAGNOSIS — N183 Chronic kidney disease, stage 3 (moderate): Principal | ICD-10-CM

## 2015-06-03 DIAGNOSIS — I129 Hypertensive chronic kidney disease with stage 1 through stage 4 chronic kidney disease, or unspecified chronic kidney disease: Secondary | ICD-10-CM

## 2015-06-07 ENCOUNTER — Telehealth: Payer: Self-pay | Admitting: Family Medicine

## 2015-06-07 DIAGNOSIS — N189 Chronic kidney disease, unspecified: Secondary | ICD-10-CM

## 2015-06-07 NOTE — Telephone Encounter (Signed)
[facility]. I do believe local nephrologist could manage his problem as well.

## 2015-06-11 NOTE — Telephone Encounter (Signed)
Left detailed message stating referral ordered and will get a call with appt date and time. To CB with any further questions or concerns.

## 2015-06-13 ENCOUNTER — Ambulatory Visit (HOSPITAL_COMMUNITY)
Admission: RE | Admit: 2015-06-13 | Discharge: 2015-06-13 | Disposition: A | Discharge status: Home / Self Care | Payer: PRIVATE HEALTH INSURANCE | Source: Ambulatory Visit | Attending: Nephrology | Admitting: Nephrology

## 2015-06-13 DIAGNOSIS — N183 Chronic kidney disease, stage 3 unspecified: Secondary | ICD-10-CM

## 2015-06-13 DIAGNOSIS — I129 Hypertensive chronic kidney disease with stage 1 through stage 4 chronic kidney disease, or unspecified chronic kidney disease: Secondary | ICD-10-CM | POA: Insufficient documentation

## 2015-08-21 ENCOUNTER — Other Ambulatory Visit: Payer: Self-pay

## 2015-08-21 MED ORDER — AMLODIPINE-VALSARTAN-HCTZ 10-160-12.5 MG PO TABS: 1.0000 | ORAL_TABLET | Freq: Every day | ORAL | Status: DC

## 2015-08-21 MED ORDER — AMLODIPINE-VALSARTAN-HCTZ 10-160-12.5 MG PO TABS: 1.0000 | ORAL_TABLET | Freq: Every day | ORAL | Status: AC

## 2015-08-21 NOTE — Telephone Encounter (Signed)
Last seen 04/16/15  Dr Miller 

## 2015-08-21 NOTE — Addendum Note (Signed)
Addended by: Frederica Kuster on: 08/21/2015 05:06 PM   Modules accepted: Orders

## 2015-08-29 ENCOUNTER — Ambulatory Visit: Payer: No Typology Code available for payment source | Admitting: Family Medicine

## 2015-08-30 ENCOUNTER — Encounter: Payer: Self-pay | Admitting: Family Medicine

## 2015-09-25 IMAGING — CR DG CHEST 2V
3 series · 3 of 3 positions shown · non-contrast
Comparison: None.

CLINICAL DATA: Cough, congestion, body aches

EXAM:
CHEST  2 VIEW

[w chest pa (1 of 2)]
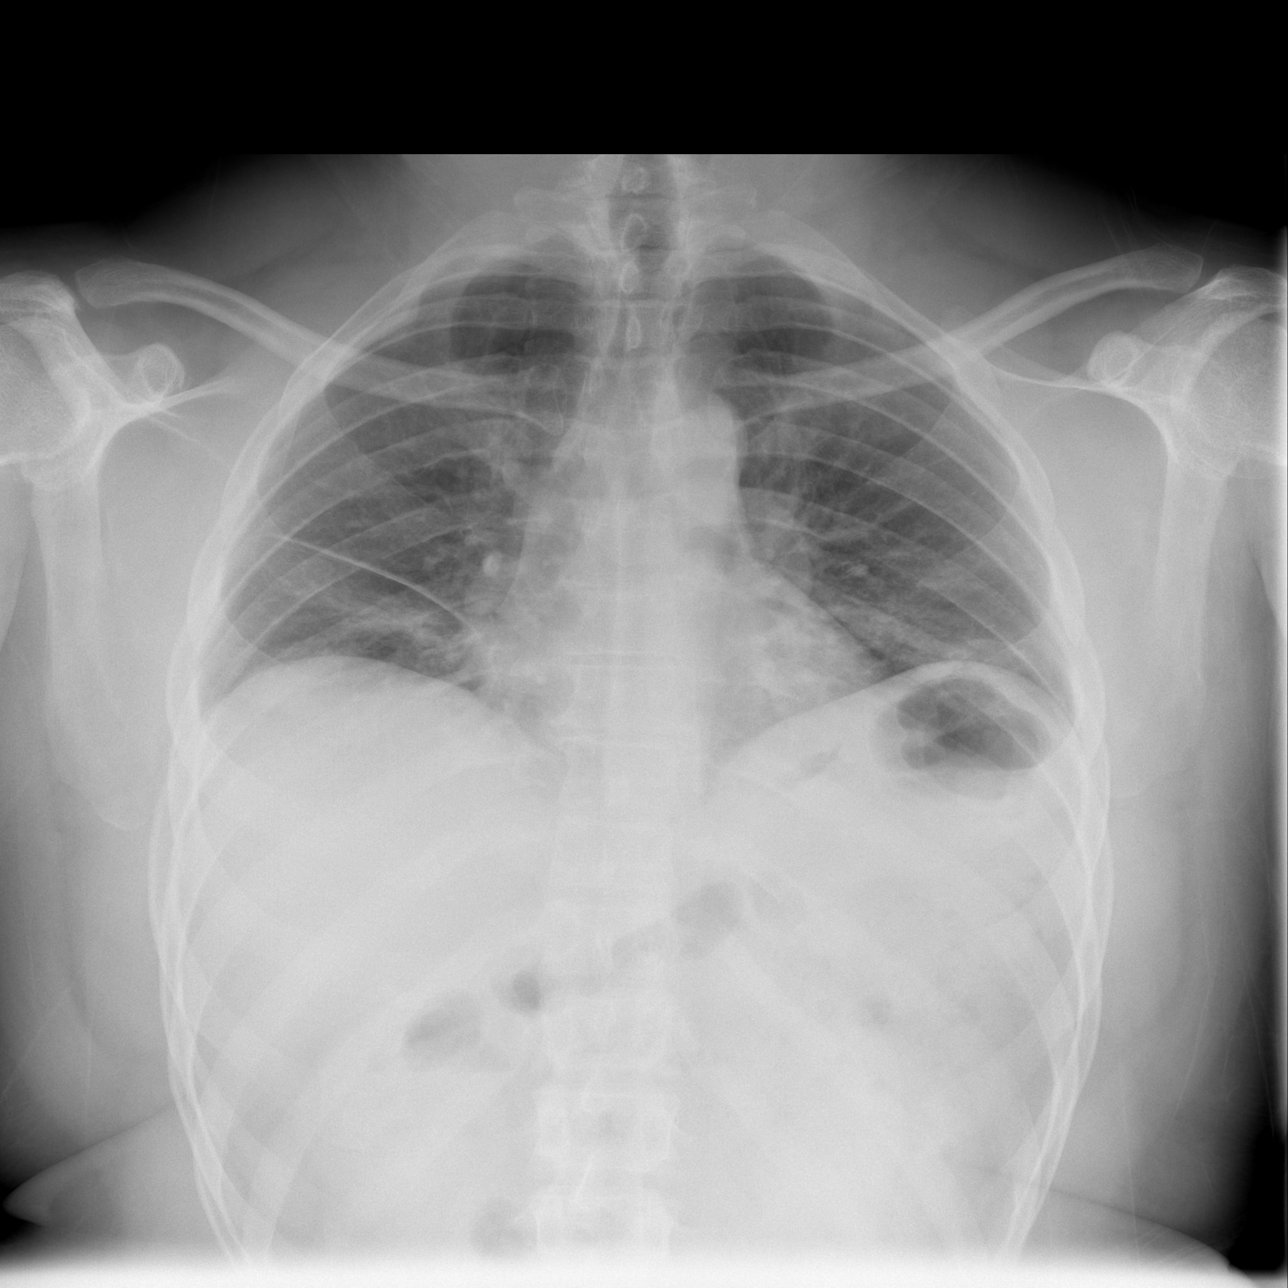

[w chest lat]
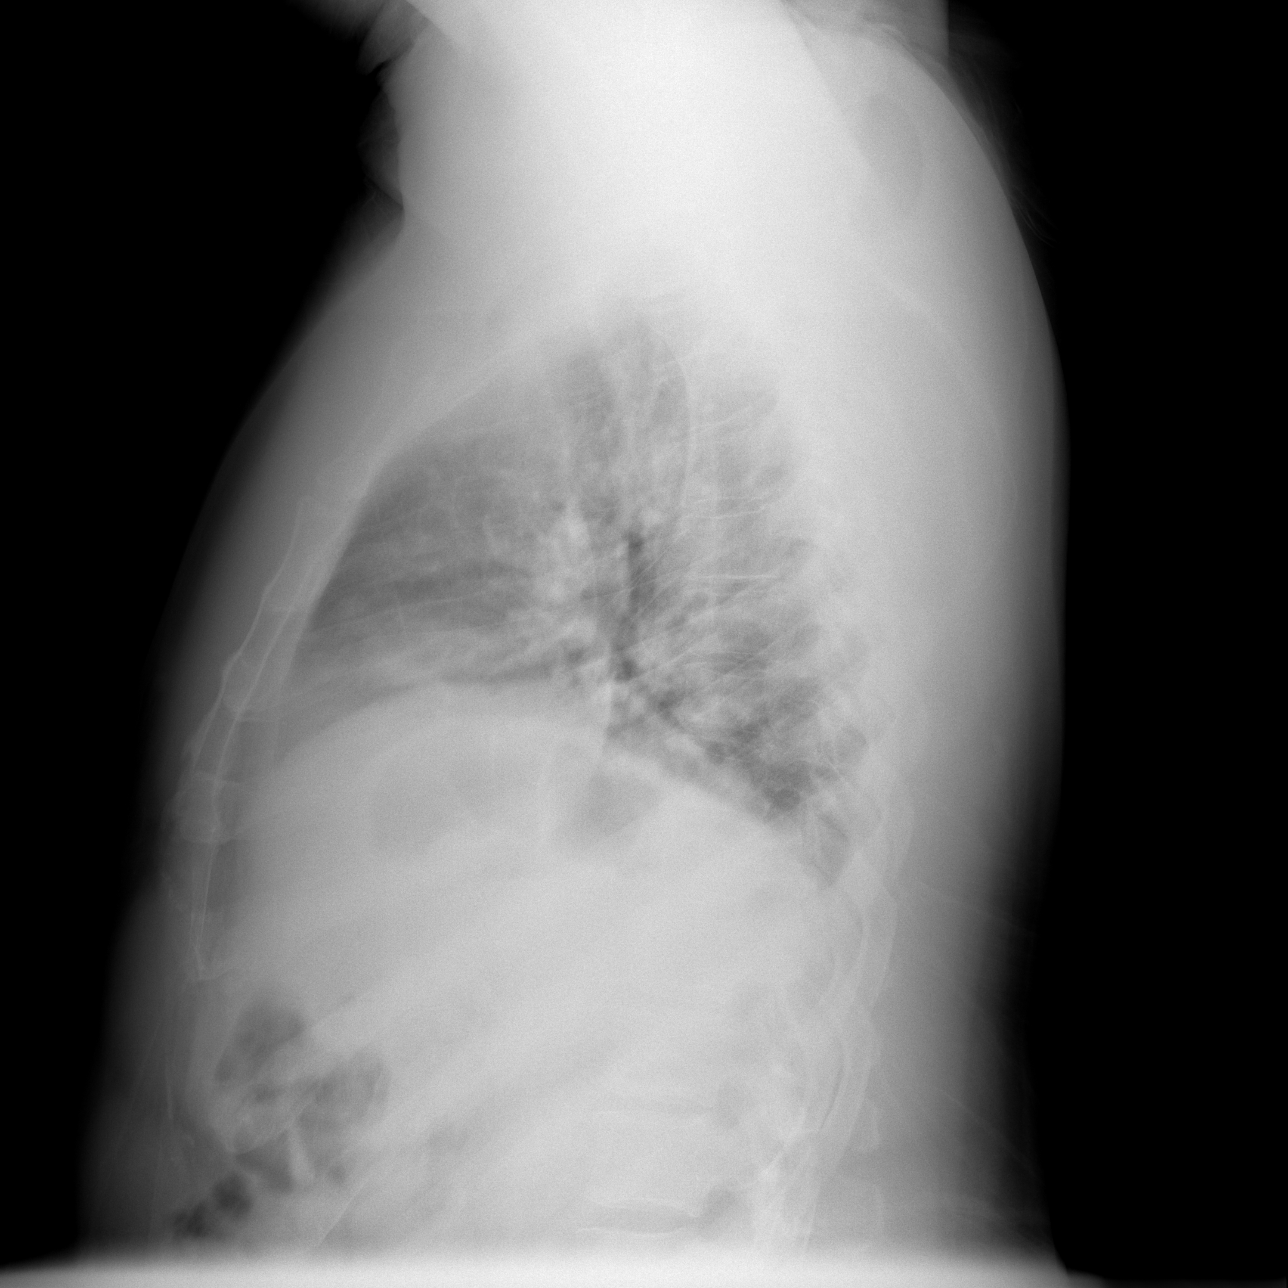

[w chest pa (2 of 2)]
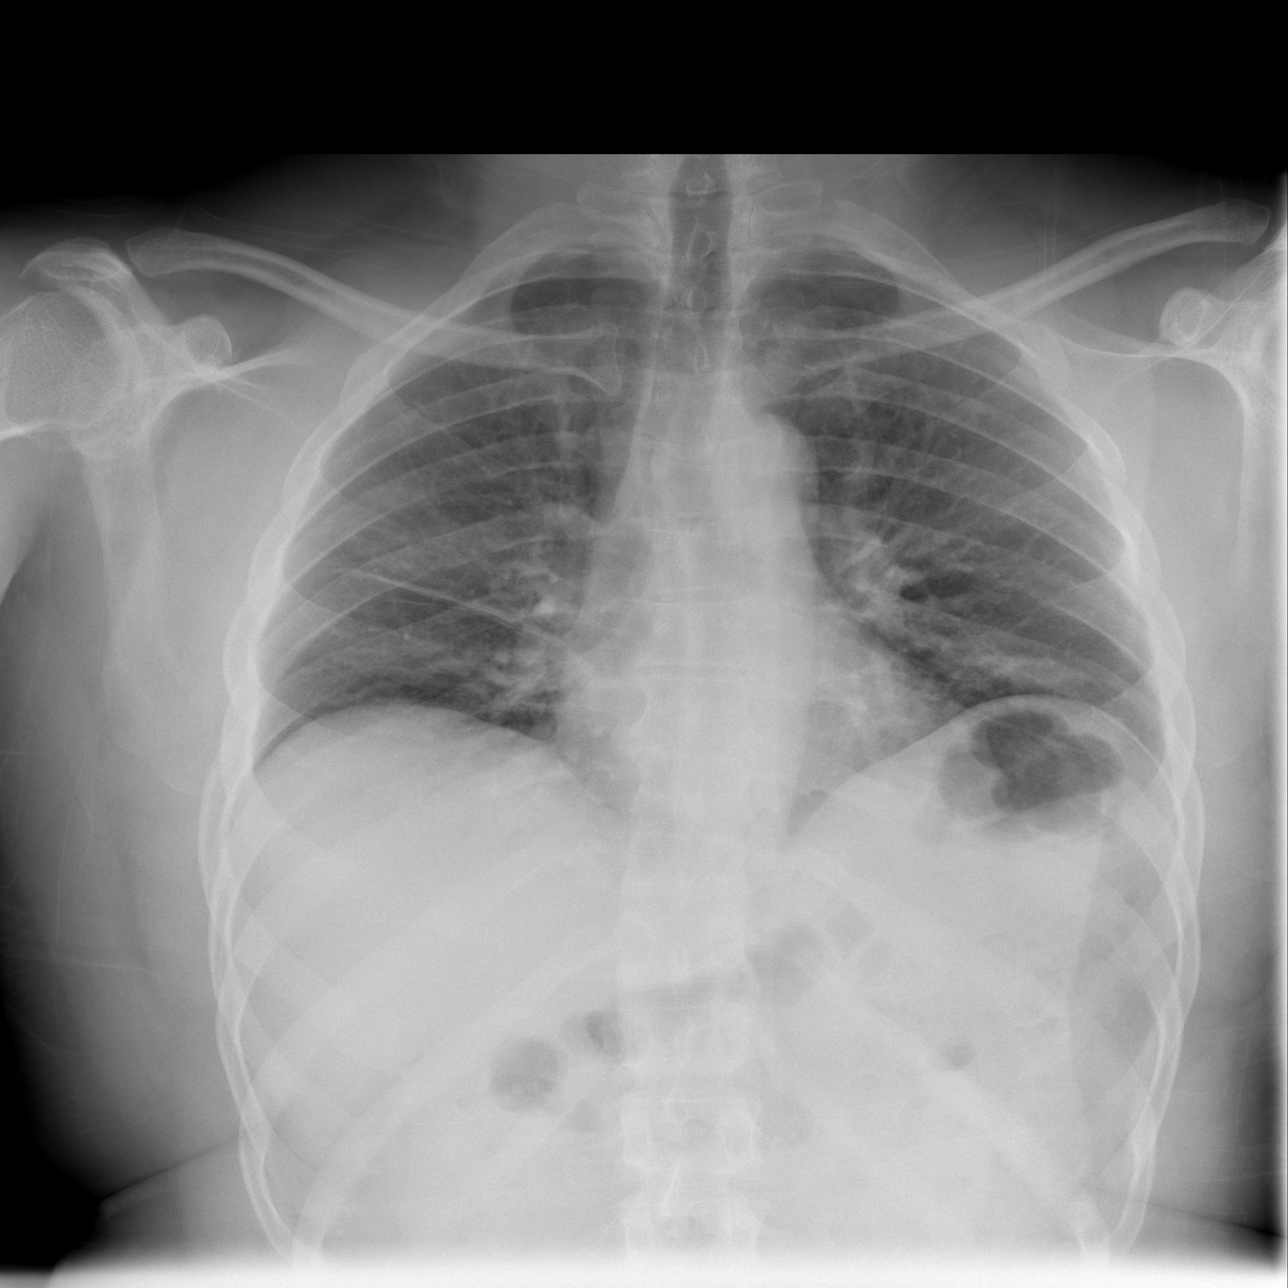

[3 of 3 positions shown; findings below may reference images not displayed]

FINDINGS: Cardiomediastinal silhouette is unremarkable. Study is limited by
poor inspiration. Streaky bilateral basilar atelectasis or
infiltrate.
IMPRESSION: Limited study by poor inspiration. Streaky bilateral basilar
atelectasis or infiltrate.

## 2015-09-26 ENCOUNTER — Other Ambulatory Visit: Payer: Self-pay | Admitting: Family Medicine

## 2015-11-07 ENCOUNTER — Other Ambulatory Visit: Payer: Self-pay | Admitting: *Deleted

## 2015-11-07 ENCOUNTER — Ambulatory Visit (INDEPENDENT_AMBULATORY_CARE_PROVIDER_SITE_OTHER): Payer: No Typology Code available for payment source | Admitting: Family Medicine

## 2015-11-07 ENCOUNTER — Encounter: Payer: Self-pay | Admitting: Family Medicine

## 2015-11-07 VITALS — BP 122/77 | HR 82 | Temp 97.9°F | Ht 67.0 in | Wt 238.0 lb

## 2015-11-07 DIAGNOSIS — I10 Essential (primary) hypertension: Secondary | ICD-10-CM | POA: Diagnosis not present

## 2015-11-07 DIAGNOSIS — N189 Chronic kidney disease, unspecified: Secondary | ICD-10-CM | POA: Diagnosis not present

## 2015-11-07 DIAGNOSIS — R739 Hyperglycemia, unspecified: Secondary | ICD-10-CM | POA: Diagnosis not present

## 2015-11-07 DIAGNOSIS — K219 Gastro-esophageal reflux disease without esophagitis: Secondary | ICD-10-CM

## 2015-11-07 LAB — BAYER DCA HB A1C WAIVED: HB A1C (BAYER DCA - WAIVED): 7.4 % — ABNORMAL HIGH (ref <7.0)

## 2015-11-07 MED ORDER — METFORMIN HCL 500 MG PO TABS: 500.0000 mg | ORAL_TABLET | Freq: Two times a day (BID) | ORAL | Status: DC

## 2015-11-07 NOTE — Progress Notes (Signed)
   Subjective:    Patient ID: James Griffith, male    DOB: 07-Feb-1971, 45 y.o.   MRN: [id number]  HPI Pt here for follow up and management of chronic medical problems which includes hypertension and CKD. He is taking medications regularly. Patient is concerned about A1c because he was told by nephrologist that he was spilling sugar in his urine. He is not really having symptoms of diabetes such as polyuria polydipsia or weight loss. Blood pressure is well controlled on amlodipine valsartan and hydrochlorothiazide.     Patient Active Problem List   Diagnosis Date Noted  . Essential hypertension 01/11/2015  . Hypertension   . Anxiety   . GERD (gastroesophageal reflux disease)   . Arthritis    Outpatient Encounter Prescriptions as of 11/07/2015  Medication Sig  . albuterol (PROVENTIL HFA;VENTOLIN HFA) 108 (90 BASE) MCG/ACT inhaler Inhale 1-2 puffs into the lungs every 6 (six) hours as needed for wheezing or shortness of breath.  . Amlodipine-Valsartan-HCTZ 10-160-12.5 MG TABS Take 1 capsule by mouth daily.  . clotrimazole-betamethasone (LOTRISONE) cream Apply 1 application topically 2 (two) times daily.  . cyclobenzaprine (FLEXERIL) 10 MG tablet Take 1 tablet (10 mg total) by mouth at bedtime.  . fluticasone (FLONASE) 50 MCG/ACT nasal spray Place 2 sprays into the nose daily.  . meloxicam (MOBIC) 7.5 MG tablet Take 1 tablet (7.5 mg total) by mouth daily.  Marland Kitchen omeprazole (PRILOSEC) 20 MG capsule Take 1 capsule (20 mg total) by mouth daily.  . [DISCONTINUED] oxyCODONE-acetaminophen (ROXICET) 5-325 MG per tablet Take 1 tablet by mouth every 4 (four) hours as needed for severe pain.  . [DISCONTINUED] Amlodipine-Valsartan-HCTZ 10-160-12.5 MG TABS TAKE 1 TABLET BY MOUTH DAILY.   No facility-administered encounter medications on file as of 11/07/2015.     Review of Systems  Constitutional: Negative.   HENT: Negative.   Eyes: Negative.   Respiratory: Negative.   Cardiovascular: Negative.     Gastrointestinal: Negative.   Endocrine: Negative.   Genitourinary: Negative.   Musculoskeletal: Negative.   Skin: Negative.   Allergic/Immunologic: Negative.   Neurological: Negative.   Hematological: Negative.   Psychiatric/Behavioral: Negative.        Objective:   Physical Exam  Constitutional: He is oriented to person, place, and time. He appears well-developed and well-nourished.  Cardiovascular: Normal rate, regular rhythm and normal heart sounds.   Pulmonary/Chest: Effort normal and breath sounds normal.  Neurological: He is oriented to person, place, and time.  Psychiatric: He has a normal mood and affect. His behavior is normal.   BP 122/77 mmHg  Pulse 82  Temp(Src) 97.9 F (36.6 C) (Oral)  Ht _0  (1.702 m)  Wt 238 lb (107.956 kg)  BMI 37.27 kg/m2        Assessment & Plan:  1. Essential hypertension Continue current medicine as pressure is well controlled - Lipid panel - CMP14+EGFR  2. Gastroesophageal reflux disease without esophagitis Symptoms are controlled with omeprazole. Continue  3. Chronic kidney disease, unspecified stage Followed by nephrology. I'll to have hypertensive kidney disease - Bayer DCA Hb A1c Waived  4. Blood glucose elevated Patient is not fasting today. We'll see what A1c shows and decide to treat or not but I believe he probably does have some diabetes based on previous fasting blood sugar - Bayer DCA Hb A1c Waived  Wardell Honour MD

## 2015-11-07 NOTE — Patient Instructions (Signed)
Continue current medications. Continue good therapeutic lifestyle changes which include good diet and exercise. Fall precautions discussed with patient. If an FOBT was given today- please return it to our front desk. If you are over 45 years old - you may need Prevnar 13 or the adult Pneumonia vaccine.  **Flu shots are available--- please call and schedule a FLU-CLINIC appointment**  After your visit with us today you will receive a survey in the mail or online from [employer] regarding your care with us. Please take a moment to fill this out. Your feedback is very important to us as you can help us better understand your patient needs as well as improve your experience and satisfaction. WE CARE ABOUT YOU!!!    

## 2015-11-08 LAB — CMP14+EGFR
A/G RATIO: 1.1 — AB (ref 1.2–2.2)
ALT: 23 IU/L (ref 0–44)
AST: 19 IU/L (ref 0–40)
Albumin: 4 g/dL (ref 3.5–5.5)
Alkaline Phosphatase: 94 IU/L (ref 39–117)
BILIRUBIN TOTAL: 0.8 mg/dL (ref 0.0–1.2)
BUN / CREAT RATIO: 9 (ref 9–20)
BUN: 16 mg/dL (ref 6–24)
CALCIUM: 9.2 mg/dL (ref 8.7–10.2)
CHLORIDE: 96 mmol/L (ref 96–106)
CO2: 24 mmol/L (ref 18–29)
Creatinine, Ser: 1.77 mg/dL — ABNORMAL HIGH (ref 0.76–1.27)
GFR, EST AFRICAN AMERICAN: 52 mL/min/{1.73_m2} — AB (ref >59)
GFR, EST NON AFRICAN AMERICAN: 45 mL/min/{1.73_m2} — AB (ref >59)
GLOBULIN, TOTAL: 3.6 g/dL (ref 1.5–4.5)
Glucose: 208 mg/dL — ABNORMAL HIGH (ref 65–99)
POTASSIUM: 3.9 mmol/L (ref 3.5–5.2)
Sodium: 135 mmol/L (ref 134–144)
TOTAL PROTEIN: 7.6 g/dL (ref 6.0–8.5)

## 2015-11-08 LAB — LIPID PANEL
CHOL/HDL RATIO: 4.5 ratio (ref 0.0–5.0)
Cholesterol, Total: 185 mg/dL (ref 100–199)
HDL: 41 mg/dL (ref >39)
LDL CALC: 123 mg/dL — AB (ref 0–99)
TRIGLYCERIDES: 107 mg/dL (ref 0–149)
VLDL Cholesterol Cal: 21 mg/dL (ref 5–40)

## 2015-12-14 ENCOUNTER — Other Ambulatory Visit: Payer: Self-pay | Admitting: Family Medicine

## 2016-01-04 ENCOUNTER — Other Ambulatory Visit: Payer: Self-pay | Admitting: Family Medicine

## 2016-04-10 ENCOUNTER — Encounter (HOSPITAL_BASED_OUTPATIENT_CLINIC_OR_DEPARTMENT_OTHER): Payer: Self-pay | Admitting: Emergency Medicine

## 2016-04-10 ENCOUNTER — Emergency Department (HOSPITAL_BASED_OUTPATIENT_CLINIC_OR_DEPARTMENT_OTHER)
Admission: EM | Admit: 2016-04-10 | Discharge: 2016-04-10 | Disposition: A | Discharge status: Home / Self Care | Payer: No Typology Code available for payment source | Attending: Emergency Medicine | Admitting: Emergency Medicine

## 2016-04-10 DIAGNOSIS — I129 Hypertensive chronic kidney disease with stage 1 through stage 4 chronic kidney disease, or unspecified chronic kidney disease: Secondary | ICD-10-CM | POA: Insufficient documentation

## 2016-04-10 DIAGNOSIS — Z7984 Long term (current) use of oral hypoglycemic drugs: Secondary | ICD-10-CM | POA: Diagnosis not present

## 2016-04-10 DIAGNOSIS — N183 Chronic kidney disease, stage 3 (moderate): Secondary | ICD-10-CM | POA: Diagnosis not present

## 2016-04-10 DIAGNOSIS — Z79899 Other long term (current) drug therapy: Secondary | ICD-10-CM | POA: Insufficient documentation

## 2016-04-10 DIAGNOSIS — E1122 Type 2 diabetes mellitus with diabetic chronic kidney disease: Secondary | ICD-10-CM | POA: Diagnosis not present

## 2016-04-10 DIAGNOSIS — J029 Acute pharyngitis, unspecified: Secondary | ICD-10-CM | POA: Insufficient documentation

## 2016-04-10 HISTORY — DX: Disorder of kidney and ureter, unspecified: N28.9

## 2016-04-10 HISTORY — DX: Type 2 diabetes mellitus without complications: E11.9

## 2016-04-10 LAB — RAPID STREP SCREEN (MED CTR MEBANE ONLY): Streptococcus, Group A Screen (Direct): NEGATIVE

## 2016-04-10 MED ORDER — PENICILLIN G BENZATHINE 1200000 UNIT/2ML IM SUSP
1.2000 10*6.[IU] | Freq: Once | INTRAMUSCULAR | Status: AC
  Administered 2016-04-10: 1.2 10*6.[IU] via INTRAMUSCULAR
  Filled 2016-04-10: qty 2

## 2016-04-10 NOTE — ED Triage Notes (Signed)
Pt c/o sore throat x 3 days, worse today. Pt with fever today at home.

## 2016-04-10 NOTE — Discharge Instructions (Signed)
You have been treated for strep throat. You may want to use chloraseptic and/or salt water gargles to help with throat pain for the next few days. Follow-up with your primary care doctor. Return here for new concerns.

## 2016-04-10 NOTE — ED Provider Notes (Signed)
[facility] Provider Note   CSN: [id number] Arrival date & time: 04/10/16  1948  By signing my name below, I, James Griffith, attest that this documentation has been prepared under the direction and in the presence of Sharilyn Sites, PA-C. Electronically Signed: Angelene Giovanni, ED Scribe. 04/10/16. 8:45 PM.   History   Chief Complaint Chief Complaint  Patient presents with  . Sore Throat    HPI Comments: James Griffith is a 45 y.o. male with a hx of DM, Hypertension, CKD, and seasonal allergies who presents to the Emergency Department complaining of gradual onset, persistent moderate sore throat with swelling onset one week ago. He reports associated fever at home and pain with swallowing. No alleviating factors noted. Pt has not tried any medications PTA. He has NKDA. He states that these symptoms are consistent with his past hx of strep throat. He denies any cough, difficulty breathing, difficulty swallowing, lip/tongue swelling, or any other symptoms.   The history is provided by the patient. No language interpreter was used.    Past Medical History:  Diagnosis Date  . Anxiety   . Arthritis   . Diabetes mellitus without complication (HCC)   . GERD (gastroesophageal reflux disease)   . Hypertension   . Renal disorder    stage 3 CKD  . Seasonal allergies     Patient Active Problem List   Diagnosis Date Noted  . Essential hypertension 01/11/2015  . Hypertension   . Anxiety   . GERD (gastroesophageal reflux disease)   . Arthritis     Past Surgical History:  Procedure Laterality Date  . WRIST FRACTURE SURGERY Left 02/04/2015   pins & screws       Home Medications    Prior to Admission medications   Medication Sig Start Date End Date Taking? Authorizing Provider  albuterol (PROVENTIL HFA;VENTOLIN HFA) 108 (90 BASE) MCG/ACT inhaler Inhale 1-2 puffs into the lungs every 6 (six) hours as needed for wheezing or shortness of breath. 08/07/14   Lorre Nick, MD  Amlodipine-Valsartan-HCTZ 10-160-12.5 MG TABS Take 1 capsule by mouth daily. 08/21/15   Frederica Kuster, MD  Amlodipine-Valsartan-HCTZ 10-160-12.5 MG TABS TAKE 1 TABLET BY MOUTH DAILY (NEED APPT) (MAX #30 ON INS) 01/06/16   Frederica Kuster, MD  clotrimazole-betamethasone (LOTRISONE) cream Apply 1 application topically 2 (two) times daily. 03/20/14   Deatra Canter, FNP  cyclobenzaprine (FLEXERIL) 10 MG tablet Take 1 tablet (10 mg total) by mouth at bedtime. 01/10/15   Frederica Kuster, MD  fluticasone (FLONASE) 50 MCG/ACT nasal spray Place 2 sprays into the nose daily. 02/15/13   Deatra Canter, FNP  meloxicam (MOBIC) 7.5 MG tablet Take 1 tablet (7.5 mg total) by mouth daily. 03/20/14   Deatra Canter, FNP  metFORMIN (GLUCOPHAGE) 500 MG tablet TAKE 1 TABLET (500 MG TOTAL) BY MOUTH 2 (TWO) TIMES DAILY WITH A MEAL. 12/16/15   Frederica Kuster, MD  omeprazole (PRILOSEC) 20 MG capsule Take 1 capsule (20 mg total) by mouth daily. 03/20/14   Deatra Canter, FNP    Family History Family History  Problem Relation Age of Onset  . Diabetes Mother   . Hypertension Mother   . Heart disease Mother   . Diabetes Father   . Hypertension Father   . Heart disease Father     Social History Social History  Substance Use Topics  . Smoking status: Never Smoker  . Smokeless tobacco: Never Used  . Alcohol use No  Allergies   Review of patient's allergies indicates no known allergies.   Review of Systems Review of Systems  Constitutional: Positive for fever.  HENT: Positive for sore throat. Negative for facial swelling and trouble swallowing.   Respiratory: Negative for cough and shortness of breath.   All other systems reviewed and are negative.    Physical Exam Updated Vital Signs BP 139/97 (BP Location: Left Arm)   Pulse 90   Temp 98.2 F (36.8 C) (Oral)   Resp 18   Ht 5\' 7"  (1.702 m)   Wt 239 lb (108.4 kg)   SpO2 99%   BMI 37.43 kg/m   Physical Exam    Constitutional: He is oriented to person, place, and time. He appears well-developed and well-nourished.  HENT:  Head: Normocephalic and atraumatic.  Right Ear: Tympanic membrane normal.  Left Ear: Tympanic membrane normal.  Nose: Nose normal.  Mouth/Throat: Uvula is midline, oropharynx is clear and moist and mucous membranes are normal.  Tonsils 1+ bilaterally with small exudates noted; uvula midline without evidence of peritonsillar abscess; handling secretions appropriately; no difficulty swallowing or speaking; normal phonation without stridor  Eyes: Conjunctivae and EOM are normal. Pupils are equal, round, and reactive to light.  Neck: Normal range of motion.  Cardiovascular: Normal rate, regular rhythm and normal heart sounds.   Pulmonary/Chest: Effort normal and breath sounds normal.  Abdominal: Soft. Bowel sounds are normal.  Musculoskeletal: Normal range of motion.  Neurological: He is alert and oriented to person, place, and time.  Skin: Skin is warm and dry.  Psychiatric: He has a normal mood and affect.  Nursing note and vitals reviewed.    ED Treatments / Results  DIAGNOSTIC STUDIES: Oxygen Saturation is 99% on RA, normal by my interpretation.    COORDINATION OF CARE: 8:43 PM- Pt advised of plan for treatment and pt agrees. Pt informed of his rapid strep test. He will recieve Penicillin.    Labs (all labs ordered are listed, but only abnormal results are displayed) Labs Reviewed  RAPID STREP SCREEN (NOT AT [facility])  CULTURE, GROUP A STREP [facility])    EKG  EKG Interpretation None       Radiology No results found.  Procedures Procedures (including critical care time)  Medications Ordered in ED Medications  penicillin g benzathine (BICILLIN LA) 1200000 UNIT/2ML injection 1.2 Million Units (1.2 Million Units Intramuscular Given 04/10/16 2059)     Initial Impression / Assessment and Plan / ED Course  Sharilyn SitesLisa Sanders, PA-C has reviewed the triage vital signs  and the nursing notes.  Pertinent labs & imaging results that were available during my care of the patient were reviewed by me and considered in my medical decision making (see chart for details).  Clinical Course   45 year old male here with sore throat for the past several days. He is afebrile and nontoxic. His tonsils are 1+ bilaterally with small exudates noted. His uvula remains midline, no evidence of peritonsillar abscess. He is handling his secretions appropriately, normal phonation without stridor. Rapid strep is negative, however given his symptoms and exam findings feel he warrants treatment. Was treated with Bicillin. Will discharge home with supportive care. Follow-up with PCP.  Discussed plan with patient, he acknowledged understanding and agreed with plan of care.  Return precautions given for new or worsening symptoms.  Final Clinical Impressions(s) / ED Diagnoses   Final diagnoses:  Sore throat    New Prescriptions New Prescriptions   No medications on file   I personally performed  the services described in this documentation, which was scribed in my presence. The recorded information has been reviewed and is accurate.    Garlon Hatchet, PA-C 04/10/16 2111    Melene Plan, DO 04/10/16 2114

## 2016-04-13 LAB — CULTURE, GROUP A STREP (THRC)

## 2016-05-20 ENCOUNTER — Other Ambulatory Visit: Payer: Self-pay | Admitting: Family Medicine

## 2016-08-05 IMAGING — CR DG CHEST 2V
2 series · 2 of 2 positions shown · non-contrast
Comparison: 07/07/2013.

CLINICAL DATA: Initial encounter for 2 week history of cough and
congestion.

EXAM:
CHEST  2 VIEW

[w chest pa]
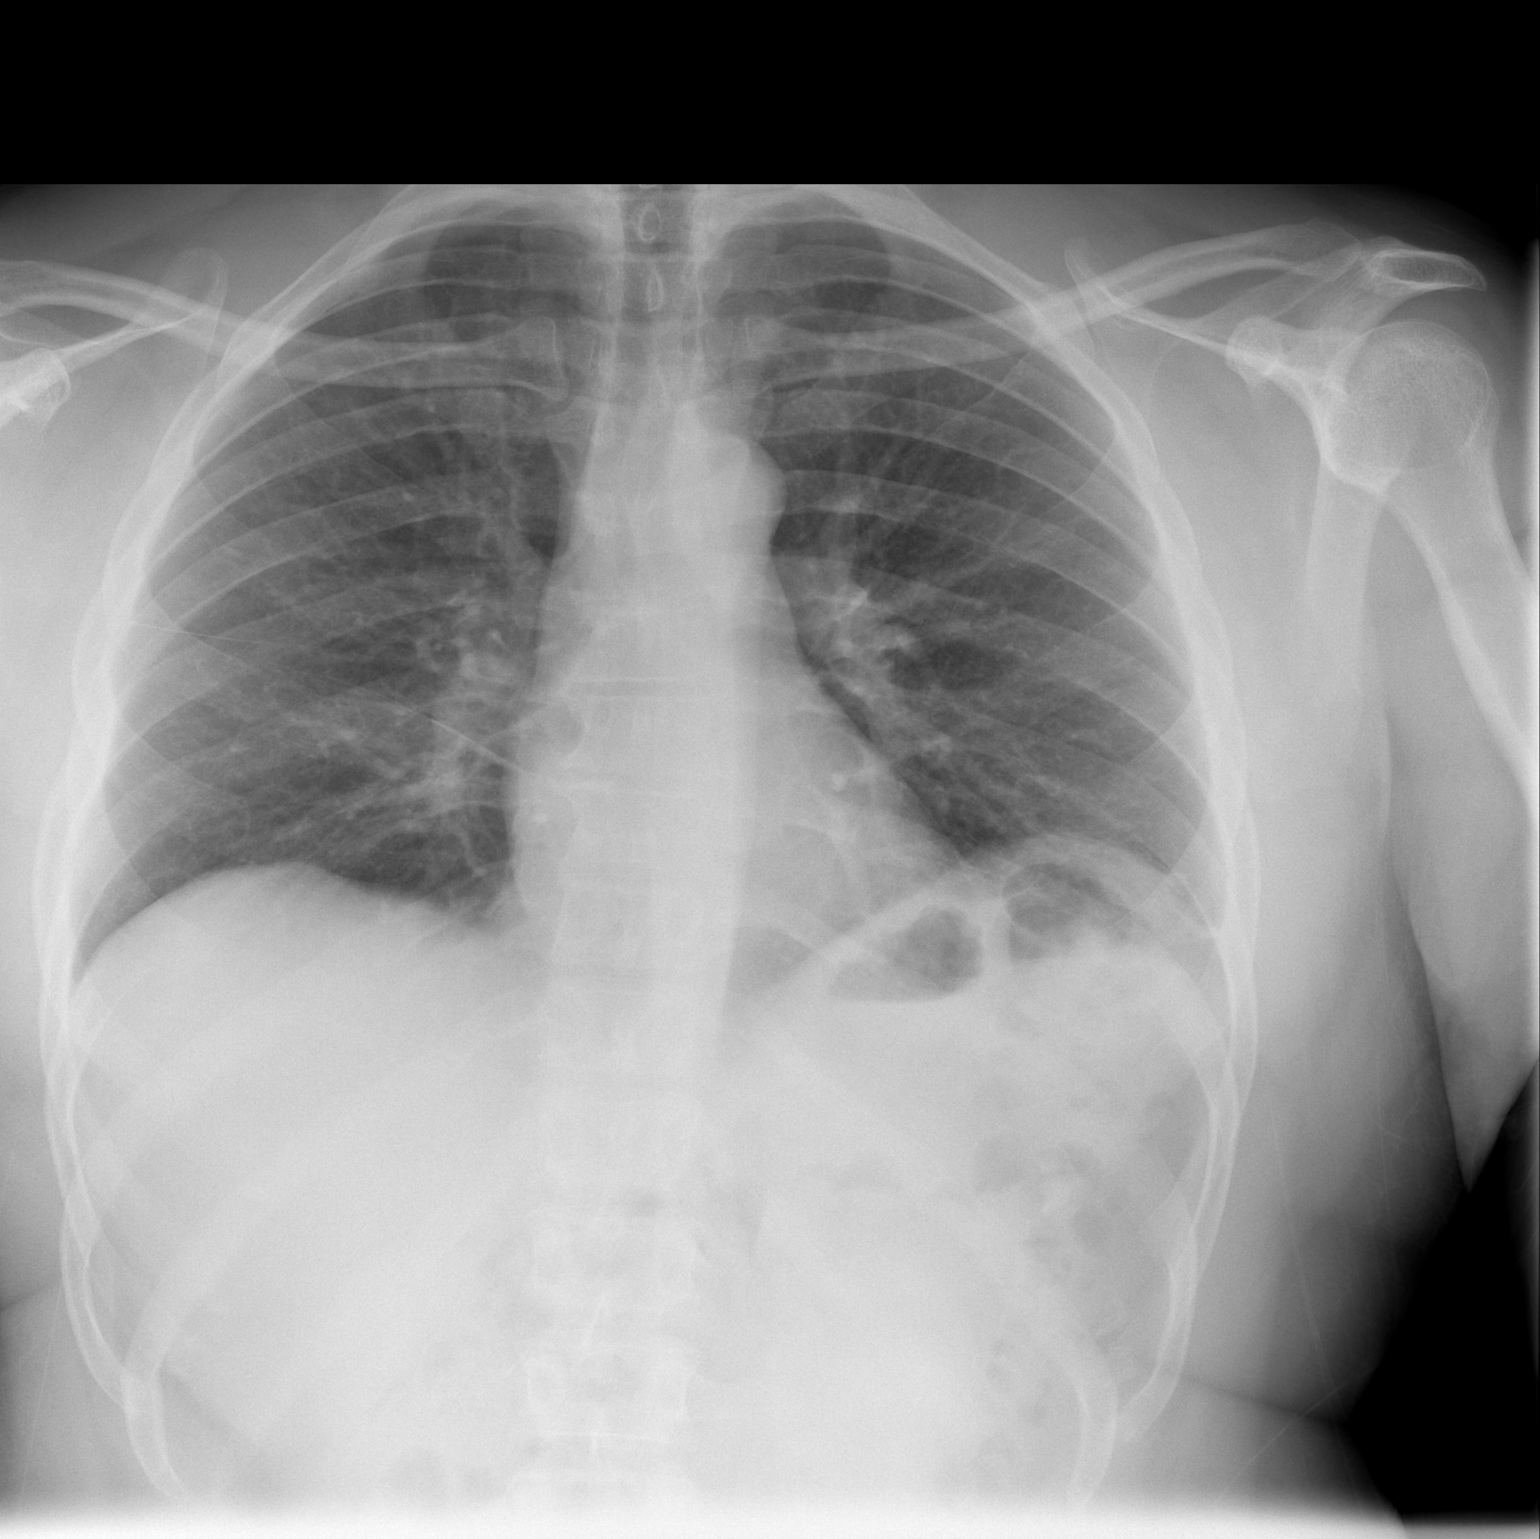

[w chest lat]
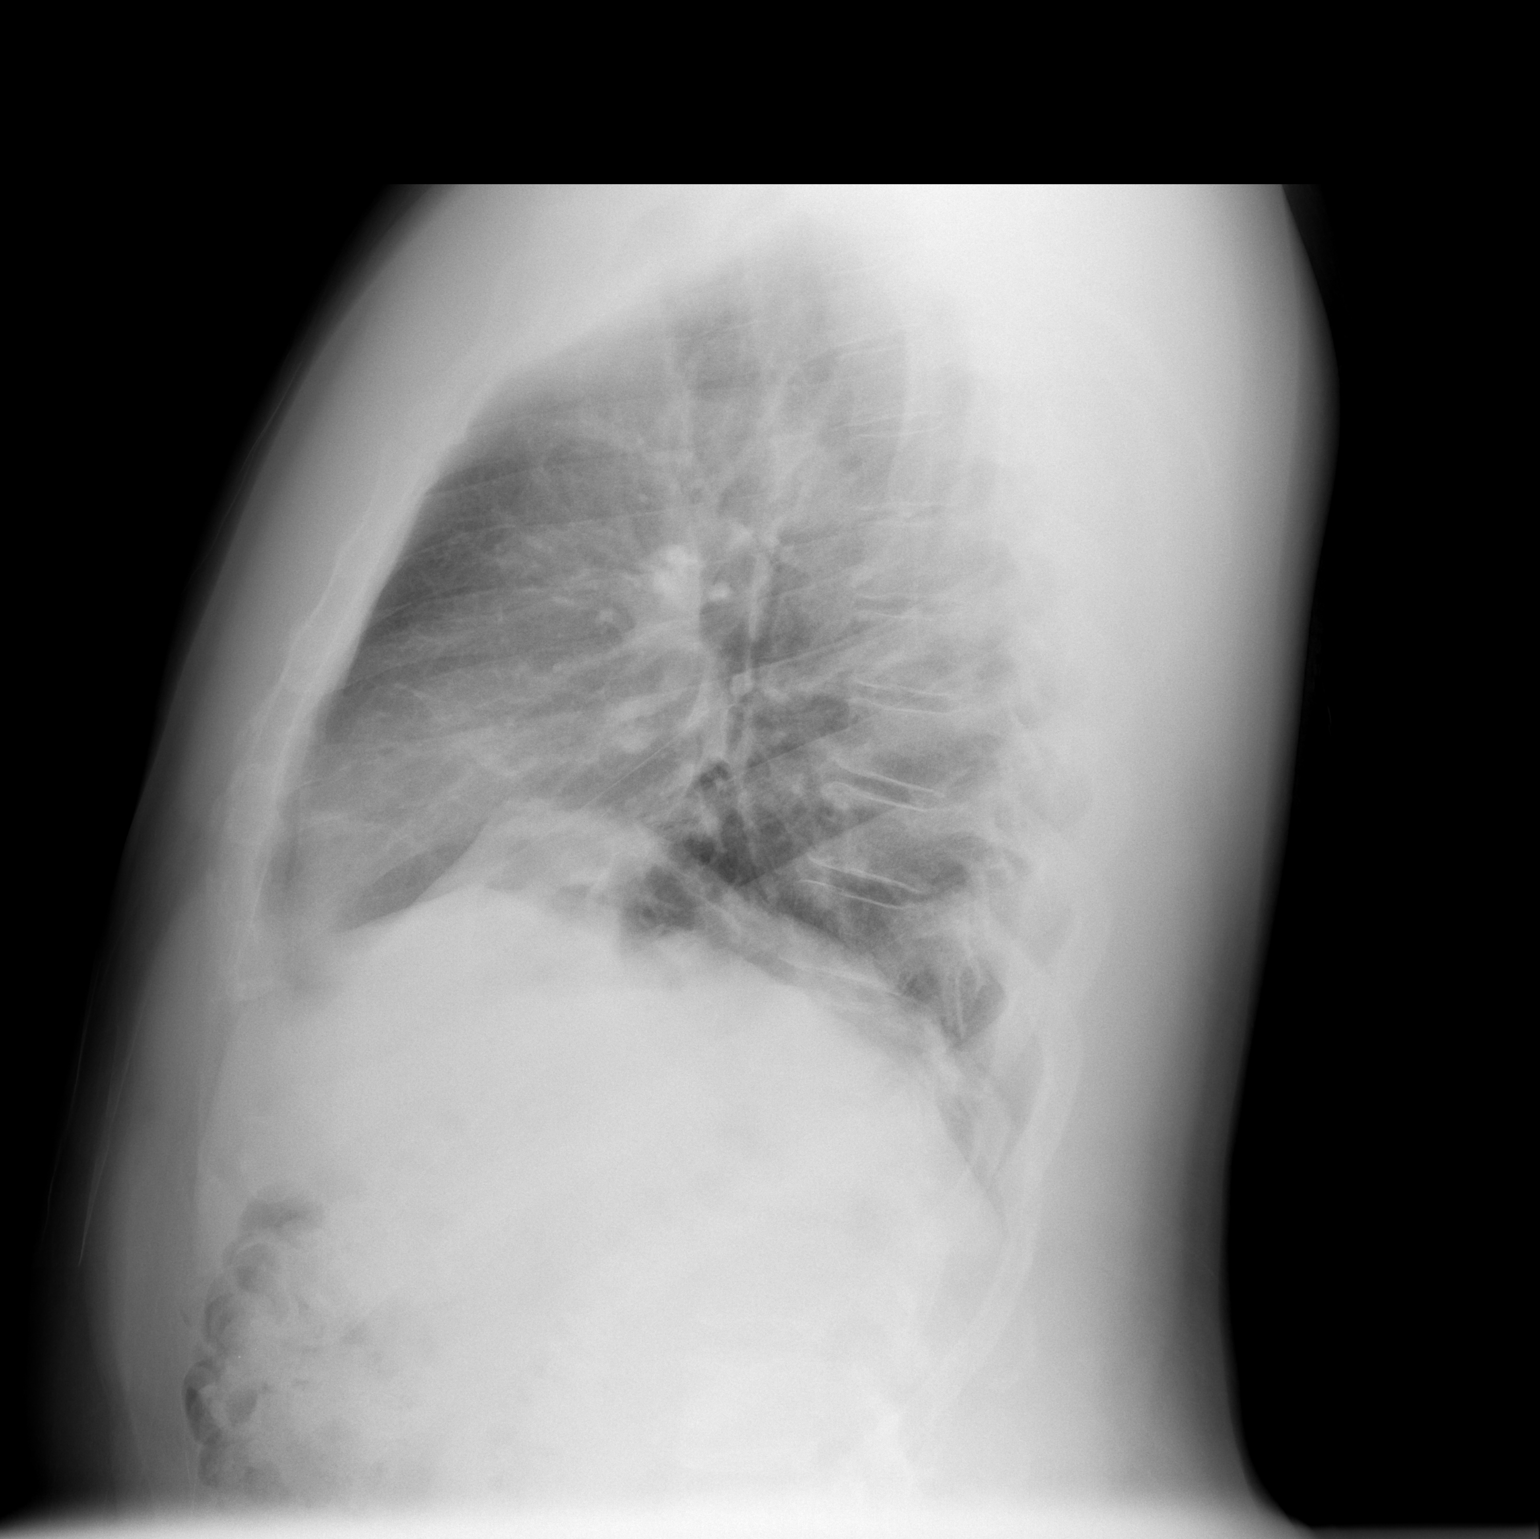

[2 of 2 positions shown; findings below may reference images not displayed]

FINDINGS: Lung volumes are low without focal airspace consolidation or
pulmonary edema. No pneumothorax or pleural effusion. The
cardiopericardial silhouette is within normal limits for size.
Imaged bony structures of the thorax are intact.
IMPRESSION: Low volume film without acute cardiopulmonary process.

## 2016-08-15 ENCOUNTER — Other Ambulatory Visit: Payer: Self-pay | Admitting: Family Medicine

## 2016-08-31 ENCOUNTER — Ambulatory Visit (INDEPENDENT_AMBULATORY_CARE_PROVIDER_SITE_OTHER): Payer: PRIVATE HEALTH INSURANCE | Admitting: Family Medicine

## 2016-08-31 ENCOUNTER — Encounter: Payer: Self-pay | Admitting: Family Medicine

## 2016-08-31 VITALS — BP 128/86 | HR 99 | Temp 98.4°F | Ht 67.0 in | Wt 242.8 lb

## 2016-08-31 DIAGNOSIS — N183 Chronic kidney disease, stage 3 unspecified: Secondary | ICD-10-CM

## 2016-08-31 DIAGNOSIS — E119 Type 2 diabetes mellitus without complications: Secondary | ICD-10-CM | POA: Diagnosis not present

## 2016-08-31 DIAGNOSIS — I10 Essential (primary) hypertension: Secondary | ICD-10-CM | POA: Diagnosis not present

## 2016-08-31 DIAGNOSIS — M1990 Unspecified osteoarthritis, unspecified site: Secondary | ICD-10-CM

## 2016-08-31 DIAGNOSIS — F419 Anxiety disorder, unspecified: Secondary | ICD-10-CM | POA: Diagnosis not present

## 2016-08-31 DIAGNOSIS — R21 Rash and other nonspecific skin eruption: Secondary | ICD-10-CM

## 2016-08-31 DIAGNOSIS — K219 Gastro-esophageal reflux disease without esophagitis: Secondary | ICD-10-CM | POA: Diagnosis not present

## 2016-08-31 DIAGNOSIS — E785 Hyperlipidemia, unspecified: Secondary | ICD-10-CM

## 2016-08-31 HISTORY — DX: Type 2 diabetes mellitus without complications: E11.9

## 2016-08-31 HISTORY — DX: Chronic kidney disease, stage 3 unspecified: N18.30

## 2016-08-31 LAB — COMPREHENSIVE METABOLIC PANEL
ALBUMIN: 4.2 g/dL (ref 3.5–5.2)
ALT: 28 U/L (ref 0–53)
AST: 23 U/L (ref 0–37)
Alkaline Phosphatase: 95 U/L (ref 39–117)
BUN: 16 mg/dL (ref 6–23)
CALCIUM: 9.3 mg/dL (ref 8.4–10.5)
CHLORIDE: 99 meq/L (ref 96–112)
CO2: 25 mEq/L (ref 19–32)
Creatinine, Ser: 1.72 mg/dL — ABNORMAL HIGH (ref 0.40–1.50)
GFR: 55.36 mL/min — AB (ref >60.00)
Glucose, Bld: 179 mg/dL — ABNORMAL HIGH (ref 70–99)
POTASSIUM: 3.8 meq/L (ref 3.5–5.1)
SODIUM: 134 meq/L — AB (ref 135–145)
Total Bilirubin: 0.8 mg/dL (ref 0.2–1.2)
Total Protein: 7.9 g/dL (ref 6.0–8.3)

## 2016-08-31 LAB — CBC
HEMATOCRIT: 45.2 % (ref 39.0–52.0)
HEMOGLOBIN: 15.6 g/dL (ref 13.0–17.0)
MCHC: 34.4 g/dL (ref 30.0–36.0)
MCV: 85.8 fl (ref 78.0–100.0)
Platelets: 322 10*3/uL (ref 150.0–400.0)
RBC: 5.27 Mil/uL (ref 4.22–5.81)
RDW: 12.9 % (ref 11.5–15.5)
WBC: 9.1 10*3/uL (ref 4.0–10.5)

## 2016-08-31 LAB — LDL CHOLESTEROL, DIRECT: LDL DIRECT: 122 mg/dL

## 2016-08-31 LAB — HEMOGLOBIN A1C: HEMOGLOBIN A1C: 8.8 % — AB (ref 4.6–6.5)

## 2016-08-31 MED ORDER — GLIMEPIRIDE 2 MG PO TABS: 2.0000 mg | ORAL_TABLET | Freq: Every day | ORAL | 5 refills | Status: AC

## 2016-08-31 NOTE — Progress Notes (Signed)
Pre visit review using our clinic review tool, if applicable. No additional management support is needed unless otherwise documented below in the visit note. 
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
## 2016-08-31 NOTE — Patient Instructions (Addendum)
Labs before you leave  Weight loss is KEY. This could help your hip pain, reduce need for additional medicine for diabetes in the future, perhaps prevent us from advising cholesterol medicine  Follow up in 4 months for repeat evaluation- possibly sooner if a1c has gone up.   We will call you within a week or two about your referral to dermatology given continued rash. If you do not hear within 3 weeks, give us a call.   Trial 150mg  zantac consistently each morning with breakfast.

## 2016-08-31 NOTE — Assessment & Plan Note (Signed)
S: poorly controlled on no medication. No myalgias.  Lab Results  Component Value Date   CHOL 185 11/07/2015   HDL 41 11/07/2015   LDLCALC 123 (H) 11/07/2015   LDLDIRECT 122.0 08/31/2016   TRIG 107 11/07/2015   CHOLHDL 4.5 11/07/2015   A/P: focus on weight loss but if cannot get under 100 on LDL or not making weight progress- likely need to suggest statin
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
## 2016-08-31 NOTE — Assessment & Plan Note (Addendum)
S:Rash over eyelids for a year. Gets better then gets worse. Used lotrisone cream. Itches some. Uses a moisturizing lotion most days.  A/P: will refer to dermatology- would prefer their opinion especially given he has been using something stronger than hydrocortisone 1% today.

## 2016-08-31 NOTE — Assessment & Plan Note (Signed)
S: using prilosec prn as he is worried about effects on kidneys. Burning epigastric pain daily.  A/P: prilosec prn--> trial zantac 150mg  daily with CKD and see if we can achieve better control

## 2016-08-31 NOTE — Assessment & Plan Note (Signed)
S: controlled on Amlodipine-valsartan-hctz 10-160-12.5mg .  BP Readings from Last 3 Encounters:  08/31/16 128/86  04/10/16 (!) 134/101  11/07/15 122/77  A/P:Continue current meds.

## 2016-08-31 NOTE — Assessment & Plan Note (Signed)
S: Thought to be related to uncontrolled blood pressure in the past. GFR 45-60 [facility]- Dr. Glenna FellowsKruska A/P: avoiding nsaids. Need to continue BP control. Need tighter DM control.

## 2016-08-31 NOTE — Assessment & Plan Note (Signed)
S: previously controlled. On metformin 500mg  BID.  Exercise and diet- seems to have low motivation for change but attempted counseling Lab Results  Component Value Date   HGBA1C 8.8 (H) 08/31/2016   HGBA1C 6.1% 02/14/2013   A/P: add amaryl 2mg  and follow up 3 months. Options are going to be more limited given his CKD. Really hopeful lifesytle changes can occur as he would likely have a faster path to insulin

## 2016-09-01 ENCOUNTER — Other Ambulatory Visit: Payer: Self-pay

## 2016-09-01 ENCOUNTER — Encounter: Payer: Self-pay | Admitting: Family Medicine

## 2016-09-01 MED ORDER — METFORMIN HCL 500 MG PO TABS: ORAL_TABLET | ORAL | 3 refills | Status: AC

## 2016-09-04 ENCOUNTER — Telehealth: Payer: Self-pay

## 2016-09-04 NOTE — Telephone Encounter (Signed)
-----   Message from Shelva MajesticStephen O Hunter, MD sent at 09/01/2016  5:21 PM EST ----- See Earleen Reapermychart message I sent patient on 09/01/16.   For some reason it took it out of your basket but here is the message  "Asher MuirJamie- Let's get him set up with a meter, strips, lancets. The main reason he needs to check is to make sure no lows. He can also check in the mornings perhaps a few times a week with goal of getting fasting blood sugar closer to 100.   Tana ConchStephen Hunter "

## 2016-09-04 NOTE — Telephone Encounter (Signed)
Called patient and left a detailed voicemail message asking patient to call and schedule a nurse visit to get set up with glucose meter. Awaiting phone call at this time.

## 2016-09-08 NOTE — Telephone Encounter (Signed)
Pt has been scheduled.  °

## 2016-09-10 ENCOUNTER — Ambulatory Visit (INDEPENDENT_AMBULATORY_CARE_PROVIDER_SITE_OTHER): Payer: PRIVATE HEALTH INSURANCE

## 2016-09-10 DIAGNOSIS — E119 Type 2 diabetes mellitus without complications: Secondary | ICD-10-CM | POA: Diagnosis not present

## 2016-09-10 LAB — POCT GLUCOSE (DEVICE FOR HOME USE)

## 2016-09-10 MED ORDER — GLUCOSE BLOOD VI STRP: ORAL_STRIP | 12 refills | Status: AC

## 2016-09-10 MED ORDER — ACCU-CHEK SOFT TOUCH LANCETS MISC: 12 refills | Status: AC

## 2016-10-27 DEATH — deceased

## 2016-12-03 ENCOUNTER — Ambulatory Visit: Payer: PRIVATE HEALTH INSURANCE | Admitting: Family Medicine
